# Patient Record
Sex: Female | Born: 1970 | Race: White | Hispanic: No | Marital: Married | Smoking: Current some day smoker
Health system: Southern US, Community
[De-identification: ages and names within clinical notes are randomized; demographics above are authoritative.]

## PROBLEM LIST (undated history)

## (undated) HISTORY — PX: CHOLECYSTECTOMY: SHX55

---

## 1997-09-26 ENCOUNTER — Other Ambulatory Visit: Admission: RE | Admit: 1997-09-26 | Discharge: 1997-09-26 | Payer: Self-pay | Admitting: Obstetrics & Gynecology

## 1997-12-19 ENCOUNTER — Other Ambulatory Visit: Admission: RE | Admit: 1997-12-19 | Discharge: 1997-12-19 | Payer: Self-pay | Admitting: Obstetrics and Gynecology

## 1998-01-03 ENCOUNTER — Ambulatory Visit (HOSPITAL_COMMUNITY): Admission: RE | Admit: 1998-01-03 | Discharge: 1998-01-03 | Payer: Self-pay | Admitting: Obstetrics & Gynecology

## 1998-01-03 ENCOUNTER — Other Ambulatory Visit: Admission: RE | Admit: 1998-01-03 | Discharge: 1998-01-03 | Payer: Self-pay | Admitting: Obstetrics and Gynecology

## 1998-02-14 ENCOUNTER — Inpatient Hospital Stay (HOSPITAL_COMMUNITY): Admission: RE | Admit: 1998-02-14 | Discharge: 1998-02-17 | Payer: Self-pay | Admitting: General Surgery

## 1998-02-27 ENCOUNTER — Inpatient Hospital Stay (HOSPITAL_COMMUNITY): Admission: AD | Admit: 1998-02-27 | Discharge: 1998-02-27 | Payer: Self-pay | Admitting: Obstetrics & Gynecology

## 1998-03-21 ENCOUNTER — Inpatient Hospital Stay (HOSPITAL_COMMUNITY): Admission: AD | Admit: 1998-03-21 | Discharge: 1998-03-22 | Payer: Self-pay | Admitting: Obstetrics and Gynecology

## 1999-05-16 ENCOUNTER — Other Ambulatory Visit: Admission: RE | Admit: 1999-05-16 | Discharge: 1999-05-16 | Payer: Self-pay | Admitting: Obstetrics and Gynecology

## 2001-04-21 ENCOUNTER — Emergency Department (HOSPITAL_COMMUNITY): Admission: EM | Admit: 2001-04-21 | Discharge: 2001-04-21 | Payer: Self-pay | Admitting: Emergency Medicine

## 2001-04-21 ENCOUNTER — Encounter: Payer: Self-pay | Admitting: Emergency Medicine

## 2002-08-12 ENCOUNTER — Emergency Department (HOSPITAL_COMMUNITY): Admission: EM | Admit: 2002-08-12 | Discharge: 2002-08-12 | Payer: Self-pay | Admitting: Emergency Medicine

## 2003-03-30 ENCOUNTER — Other Ambulatory Visit: Admission: RE | Admit: 2003-03-30 | Discharge: 2003-03-30 | Payer: Self-pay | Admitting: Obstetrics and Gynecology

## 2003-09-14 ENCOUNTER — Inpatient Hospital Stay (HOSPITAL_COMMUNITY): Admission: AD | Admit: 2003-09-14 | Discharge: 2003-09-14 | Payer: Self-pay | Admitting: Obstetrics and Gynecology

## 2003-09-17 ENCOUNTER — Inpatient Hospital Stay (HOSPITAL_COMMUNITY): Admission: AD | Admit: 2003-09-17 | Discharge: 2003-09-18 | Payer: Self-pay | Admitting: Obstetrics and Gynecology

## 2003-10-28 ENCOUNTER — Other Ambulatory Visit: Admission: RE | Admit: 2003-10-28 | Discharge: 2003-10-28 | Payer: Self-pay | Admitting: Obstetrics and Gynecology

## 2005-03-04 ENCOUNTER — Inpatient Hospital Stay (HOSPITAL_COMMUNITY): Admission: AD | Admit: 2005-03-04 | Discharge: 2005-03-04 | Payer: Self-pay | Admitting: Obstetrics and Gynecology

## 2006-07-16 ENCOUNTER — Inpatient Hospital Stay (HOSPITAL_COMMUNITY): Admission: AD | Admit: 2006-07-16 | Discharge: 2006-07-16 | Payer: Self-pay | Admitting: Obstetrics and Gynecology

## 2006-07-21 ENCOUNTER — Observation Stay (HOSPITAL_COMMUNITY): Admission: AD | Admit: 2006-07-21 | Discharge: 2006-07-22 | Payer: Self-pay | Admitting: Gynecology

## 2006-08-25 ENCOUNTER — Inpatient Hospital Stay (HOSPITAL_COMMUNITY): Admission: AD | Admit: 2006-08-25 | Discharge: 2006-08-27 | Payer: Self-pay | Admitting: Obstetrics and Gynecology

## 2006-09-01 ENCOUNTER — Inpatient Hospital Stay (HOSPITAL_COMMUNITY): Admission: AD | Admit: 2006-09-01 | Discharge: 2006-09-01 | Payer: Self-pay | Admitting: Obstetrics and Gynecology

## 2007-01-30 ENCOUNTER — Inpatient Hospital Stay (HOSPITAL_COMMUNITY): Admission: AD | Admit: 2007-01-30 | Discharge: 2007-02-01 | Payer: Self-pay | Admitting: Obstetrics and Gynecology

## 2007-01-30 ENCOUNTER — Encounter (INDEPENDENT_AMBULATORY_CARE_PROVIDER_SITE_OTHER): Payer: Self-pay | Admitting: Obstetrics and Gynecology

## 2007-11-14 ENCOUNTER — Emergency Department (HOSPITAL_COMMUNITY): Admission: EM | Admit: 2007-11-14 | Discharge: 2007-11-14 | Payer: Self-pay | Admitting: Emergency Medicine

## 2007-12-07 ENCOUNTER — Ambulatory Visit: Payer: Self-pay | Admitting: Internal Medicine

## 2007-12-08 ENCOUNTER — Ambulatory Visit: Payer: Self-pay | Admitting: *Deleted

## 2008-09-08 ENCOUNTER — Emergency Department (HOSPITAL_COMMUNITY): Admission: EM | Admit: 2008-09-08 | Discharge: 2008-09-08 | Payer: Self-pay | Admitting: Emergency Medicine

## 2009-04-04 ENCOUNTER — Emergency Department (HOSPITAL_COMMUNITY): Admission: EM | Admit: 2009-04-04 | Discharge: 2009-04-05 | Payer: Self-pay | Admitting: Emergency Medicine

## 2010-07-28 ENCOUNTER — Encounter: Payer: Self-pay | Admitting: *Deleted

## 2010-10-12 LAB — CBC
HCT: 43.8 % (ref 36.0–46.0)
Hemoglobin: 15 g/dL (ref 12.0–15.0)
MCHC: 34.2 g/dL (ref 30.0–36.0)
Platelets: 320 10*3/uL (ref 150–400)
RDW: 12.8 % (ref 11.5–15.5)

## 2010-10-12 LAB — URINALYSIS, ROUTINE W REFLEX MICROSCOPIC
Bilirubin Urine: NEGATIVE
Ketones, ur: NEGATIVE mg/dL
Nitrite: NEGATIVE
Protein, ur: NEGATIVE mg/dL
pH: 6 (ref 5.0–8.0)

## 2010-10-12 LAB — DIFFERENTIAL
Lymphs Abs: 4.8 10*3/uL — ABNORMAL HIGH (ref 0.7–4.0)
Monocytes Absolute: 1.3 10*3/uL — ABNORMAL HIGH (ref 0.1–1.0)
Monocytes Relative: 6 % (ref 3–12)
Neutro Abs: 14.7 10*3/uL — ABNORMAL HIGH (ref 1.7–7.7)
Neutrophils Relative %: 70 % (ref 43–77)

## 2010-10-12 LAB — POCT PREGNANCY, URINE: Preg Test, Ur: NEGATIVE

## 2010-10-12 LAB — COMPREHENSIVE METABOLIC PANEL
Albumin: 4 g/dL (ref 3.5–5.2)
BUN: 5 mg/dL — ABNORMAL LOW (ref 6–23)
Calcium: 9.1 mg/dL (ref 8.4–10.5)
Glucose, Bld: 99 mg/dL (ref 70–99)
Sodium: 138 mEq/L (ref 135–145)
Total Protein: 7 g/dL (ref 6.0–8.3)

## 2010-10-18 LAB — POCT I-STAT, CHEM 8
Calcium, Ion: 1.03 mmol/L — ABNORMAL LOW (ref 1.12–1.32)
Glucose, Bld: 119 mg/dL — ABNORMAL HIGH (ref 70–99)
HCT: 50 % — ABNORMAL HIGH (ref 36.0–46.0)
Hemoglobin: 17 g/dL — ABNORMAL HIGH (ref 12.0–15.0)
TCO2: 26 mmol/L (ref 0–100)

## 2010-10-18 LAB — COMPREHENSIVE METABOLIC PANEL
AST: 32 U/L (ref 0–37)
Albumin: 4.2 g/dL (ref 3.5–5.2)
Alkaline Phosphatase: 71 U/L (ref 39–117)
BUN: 14 mg/dL (ref 6–23)
Chloride: 95 mEq/L — ABNORMAL LOW (ref 96–112)
Potassium: 3.4 mEq/L — ABNORMAL LOW (ref 3.5–5.1)
Total Bilirubin: 1.6 mg/dL — ABNORMAL HIGH (ref 0.3–1.2)

## 2010-10-18 LAB — URINE MICROSCOPIC-ADD ON

## 2010-10-18 LAB — CBC
HCT: 46.7 % — ABNORMAL HIGH (ref 36.0–46.0)
Platelets: 319 10*3/uL (ref 150–400)
RBC: 5.14 MIL/uL — ABNORMAL HIGH (ref 3.87–5.11)
WBC: 13.4 10*3/uL — ABNORMAL HIGH (ref 4.0–10.5)

## 2010-10-18 LAB — DIFFERENTIAL
Basophils Absolute: 0 10*3/uL (ref 0.0–0.1)
Basophils Relative: 0 % (ref 0–1)
Eosinophils Relative: 0 % (ref 0–5)
Monocytes Absolute: 1.1 10*3/uL — ABNORMAL HIGH (ref 0.1–1.0)
Neutro Abs: 9.1 10*3/uL — ABNORMAL HIGH (ref 1.7–7.7)

## 2010-10-18 LAB — POCT PREGNANCY, URINE: Preg Test, Ur: NEGATIVE

## 2010-10-18 LAB — URINALYSIS, ROUTINE W REFLEX MICROSCOPIC
Leukocytes, UA: NEGATIVE
Nitrite: NEGATIVE
Specific Gravity, Urine: 1.023 (ref 1.005–1.030)
pH: 6.5 (ref 5.0–8.0)

## 2010-10-18 LAB — URINE CULTURE: Colony Count: NO GROWTH

## 2010-11-20 NOTE — H&P (Signed)
NAME:  Brenda Rodgers, Brenda Rodgers NO.:  [id number]   MEDICAL RECORD NO.:  [id number]          PATIENT TYPE:  INP   LOCATION:  9171                          FACILITY:  [facility]   PHYSICIAN:  Crist Fat. Rivard, M.D. DATE OF BIRTH:  16-Feb-1971   DATE OF ADMISSION:  01/30/2007  DATE OF DISCHARGE:                              HISTORY & PHYSICAL   This is a 40 year old, gravida 4, para 3, 0, 0, 3, at 39-5/7 weeks who  presents with leaking fluid and strong contractions.  She has positive  fetal movement.  Pregnancy has been followed by the nurse midwife  service and remarkable for:  1. Previous cesarean section with 2 v-backs.  2. History of positive PPD.  3. AMA.  4. Hyperemesis.  5. Group B strep negative.   ALLERGIES:  LATEX.   OB HISTORY:  Remarkable for C-section in 1992 of a female infant at [redacted]  weeks gestation weighing 7 pounds 4 ounces.  Remarkable for failure to  progress.  She had a v-back in 1999 of a female infant at [redacted] weeks  gestation weighing 8 pounds 3 ounces.  She had another v-back in 2005 of  a female infant at [redacted] weeks gestation weighing 7 pounds 8 ounces.  Remarkable for an abnormal quad screen.   MEDICAL HISTORY:  Remarkable for history of hyperemesis in all  pregnancies.  History of preterm labor.  Childhood varicella.  History  of abnormality pap in 2004.  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   SURGICAL HISTORY:  Remarkable for C-section in 1992, cholecystectomy in  1999 and wisdom teeth.   FAMILY HISTORY:  Remarkable for a father with heart disease and an aunt  with varicosities, aunt with emphysema and diabetes and leukemia,  another aunt with lung cancer, grandfather with unknown type of cancer.   GENETIC HISTORY:  Remarkable for the patient's age of 49 and a first  cousin with cleft palate.   SOCIAL HISTORY:  The patient is married to [employer] who is involved  and supportive.  She is of the [place] faith.  She denies  any alcohol,  tobacco or drug use.   HISTORY OF CURRENT PREGNANCY:  Patient under care at [redacted] weeks gestation.  She was treated with Phenergan and Zofran for hyperemesis.  She had a  first trimester screen that was normal.  She had an ultrasound at 18  weeks that was normal.  She declined quad screen and amniocentesis.  She  had a Glucola at 27 weeks that was normal.  She had some contractions at  26 weeks with negative fetal fibronectin.  She did sign tubal ligation  papers and she had a group B strep that was negative at term.   OBJECTIVE DATA:  VITAL SIGNS:  Stable, afebrile.  HEENT:  Within normal limits.  Thyroid normal and not enlarged.  CHEST:  Clear to auscultation.  HEART:  Regular rate and rhythm.  ABDOMEN:  Gravid, 38 cm, vertex, Leopold, EFM shows reactive fetal heart  rate with contractions every 2-3 minutes, cervix is 7-8 cm,  completely  effaced, -1 station with a vertex presentation and bulging membranes.  Small amount of clear fluid is leaking.  EXTREMITIES:  Within normal limits.   ASSESSMENT:  1. Intrauterine pregnancy at 39-5/7 weeks.  2. Active labor, transition.   PLAN:  1. Admit to birthing suites, Dr. Estanislado Pandy notified.  2. Routine CNM orders.  3. The patient requests epidural.      Marie L. Williams, C.N.M.      Crist Fat Rivard, M.D.  Electronically Signed    MLW/MEDQ  D:  01/30/2007  T:  01/30/2007  Job:  [id number]

## 2010-11-20 NOTE — Discharge Summary (Signed)
NAME:  Brenda Rodgers, Brenda Rodgers NO.:  [id number]   MEDICAL RECORD NO.:  [id number]          PATIENT TYPE:  INP   LOCATION:  9119                          [facility]   PHYSICIAN:  Crist Fat. Rivard, M.D. DATE OF BIRTH:  [birth year]   DATE OF ADMISSION:  01/30/2007  DATE OF DISCHARGE:  02/01/2007                               DISCHARGE SUMMARY   ADMISSION DIAGNOSIS:  1. Intrauterine pregnancy at 39-5/7 weeks.  2. Active labor, transition.   DISCHARGE DIAGNOSES:  1. Intrauterine pregnancy at 39-5/7 weeks.  2. Active labor, transition.  3. Status post spontaneous vaginal delivery of a female infant named      [place], weighing 7 pounds 4 ounces Apgars of 8 and 9.  4. Desires elective sterilization and status post bilateral tubal      ligation.   HOSPITAL PROCEDURES:  1. Epidural anesthesia.  2. Spontaneous vaginal delivery.  3. Bilateral tubal ligation.   HOSPITAL COURSE:  The patient entered care in active labor with cervix  of 7-8 cm.  She requested an epidural and that was placed and she  delivered soon thereafter with a spontaneous vaginal delivery of a  female named Julian Reil, Apgars 08/09 weighing 7 pounds 4 ounces with tight  shoulders.  She had no complications. Otherwise placenta delivered  without problems and she was found to have no laceration.  She had  requested a tubal ligation.  This was performed a short time after birth  with Dr. Estanislado Pandy under epidural anesthesia with no complications.  On  postpartum day #1 she was doing well, breast-feeding, hemoglobin was  10.4.  Her exam was normal and she was given routine care.  On  postpartum day #2 she was ready to go home.  She was breast-feeding  well, up and about without problems, eating and tolerating food, chest  was clear.  Heart rate regular rate and rhythm.  Abdomen: Soft and  appropriately tender.  Incision was clean, dry and intact over  umbilicus.  Lochia was within normal limits.  Extremities within  normal  limits.  She was deemed to received full benefit of hospital stay and  discharged home.   DISCHARGE MEDICATIONS:  1. Motrin 600 mg p.o. q.6 hours p.r.n.  2. Tylox one to two p.o. q.4 hours p.r.n.   DISCHARGE LABS:  White blood cell count 12.5, hemoglobin 10.4, platelets  281.  RPR nonreactive.   DISCHARGE INSTRUCTIONS:  Per [facility] handout.  Discharge follow up in 6  weeks or p.r.n.   CONDITION ON DISCHARGE:  Good.      Marie L. Williams, C.N.M.      Crist Fat Rivard, M.D.  Electronically Signed    MLW/MEDQ  D:  02/01/2007  T:  02/01/2007  Job:  [id number]

## 2010-11-20 NOTE — Op Note (Signed)
NAME:  Brenda Rodgers, Brenda Rodgers NO.:  [id number]   MEDICAL RECORD NO.:  [id number]          PATIENT TYPE:  INP   LOCATION:  9119                          [facility]   PHYSICIAN:  Crist Fat. Rivard, M.D. DATE OF BIRTH:  1970/12/22   DATE OF PROCEDURE:  01/30/2007  DATE OF DISCHARGE:                               OPERATIVE REPORT   PREOPERATIVE DIAGNOSIS:  Desire for sterilization.   POSTOPERATIVE DIAGNOSIS:  Desire for sterilization.   ANESTHESIA:  Epidural, Dr. Jean Rosenthal.   PROCEDURE:  Postpartum bilateral tubal ligation.   SURGEON:  Crist Fat. Rivard, M.D.   ESTIMATED BLOOD LOSS:  Minimal.   PROCEDURE IN DETAIL:  After being informed of the planned procedure with  possible complications including bleeding, infection, injury to other  organs, irreversibility and failure rate of 1 in 1000, informed consent  is obtained.  The patient is taken to OR #3 and previously placed  epidural anesthesia is reinforced.  The patient is then prepped and  draped in a sterile fashion and her bladder is emptied with an in-and-  out Foley catheter.  After assessing an adequate level of anesthesia, we  infiltrate the umbilical area with 10 mL of Marcaine 0.25% and we  perform a semielliptical incision which is brought down bluntly to the  fascia.  The fascia is identified, grasped with Kocher forceps and  incised, and the peritoneum is entered bluntly.   We first identified the right tube which is grasped with Babcock forceps  until the fimbriae is exteriorized then a section of the isthmic  ampullary area is chosen.  The mesosalpinx is entered with  cauterization.  We doubly ligate each stump with 0 chromic, remove a  section of 1.5 cm of isthmic ampullary area and cauterize each stump.  Hemostasis is adequate and we proceed in the same exact fashion on the  left side, again after identifying the fimbriae.   The fascia is then closed with a running suture of 0 Vicryl.  Hemostasis  was completed with cautery and the skin is closed with a subcuticular  suture of 3-0 Monocryl and Steri-Strips.  Instrument and sponge count is  complete x2.  Estimated blood loss is minimal.  The procedure is very  well tolerated by the patient who is taken to the recovery room in a  well and stable condition.   SPECIMEN:  Portion of right tube and left tube sent to pathology.     Crist Fat Rivard, M.D.  Electronically Signed    SAR/MEDQ  D:  01/30/2007  T:  01/31/2007  Job:  [id number]

## 2010-11-23 NOTE — H&P (Signed)
NAME:  Brenda Rodgers, Brenda Rodgers                           ACCOUNT NO.:  [id number]   MEDICAL RECORD NO.:  [id number]                   PATIENT TYPE:  INP   LOCATION:  9163                                 [facility]   PHYSICIAN:  Naima A. Dillard, M.D.              DATE OF BIRTH:  [birth year]   DATE OF ADMISSION:  09/17/2003  DATE OF DISCHARGE:                                HISTORY & PHYSICAL   HISTORY OF PRESENT ILLNESS:  Ms. Brenda Rodgers is a 40 year old white female  gravida 3 para 2-0-0-2 at [redacted] weeks gestation by early ultrasound who  presents complaining of uterine contractions every 3-4 minutes since about  3:45 a.m. this morning.  She reports increased discharge but no gushes of  fluid.  She denies any vaginal bleeding, nausea, vomiting, headaches or  visual disturbances.  Her pregnancy has been followed at [facility] by the certified nurse midwife service and has been essentially  uncomplicated though at risk for:  1. A previous low transverse cesarean section with a subsequent vaginal     birth and desires a vaginal birth again this time.  2. Increased risk of Downs syndrome on her quad screen and declined     amniocentesis.  Targeted ultrasound decreased her risk from 1:100 to     1:200.  3. 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   Her group B strep is negative and she is currently requesting an epidural  for labor.   OBSTETRICAL AND GYNECOLOGICAL HISTORY:  She is a gravida 3 para 2-0-0-2.  Delivered a viable female in August 1992 who weighed 7 pounds 14 ounces at  [redacted] weeks gestation.  She had that C-section secondary to failure to progress  and prolonged rupture of the membranes.  In September 1999 she delivered a  viable female infant who weighed 8 pounds 3 ounces at [redacted] weeks gestation  vaginally without complications and did have an epidural with that labor.   GENERAL MEDICAL HISTORY:  She  has no known drug allergies.  She reports that  sometimes latex gloves irritate her hands but she has never had any other  reaction.  She reports having had the usual childhood diseases.  She reports  a positive TB skin test and a chest x-ray that was negative in 1992 and in  2002.  She has smoked early in this pregnancy but discontinued somewhere in  the middle of the pregnancy.   FAMILY HISTORY:  Significant for father with angina and has had two stents  and balloon therapy.  Maternal aunt with varicosities.  Maternal aunt with  emphysema and adult-onset diabetes, and maternal aunt with leukemia, now  deceased.  A paternal uncle with questionable lung cancer.  Maternal  grandmother with unknown kind of cancer.   SURGICAL HISTORY:  Includes a cesarean section in  1992, cholecystectomy  during her pregnancy in 1999, and wisdom teeth removed.   GENETIC HISTORY:  Significant for a first cousin with a cleft palate.   SOCIAL HISTORY:  She is not currently married.  The father of the baby is  involved and supportive and his name is Merlyn Albert.  They are of the [place]  faith.  They deny any drug use or alcohol during the pregnancy and smoking  discontinued at approximately 30 weeks.   PRENATAL LABORATORY DATA:  Her blood type is O positive, antibody screen is  negative.  Toxo titers are negative.  Syphilis is nonreactive.  Rubella is  immune.  Hepatitis B surface antigen is negative.  HIV is nonreactive.  GC  and chlamydia are both negative.  Pap was ASCUS and a follow-up showed  positive high-risk HPV.  Cystic fibrosis is negative and parvo titers were  immune.  Her 36-week beta strep was negative.   PHYSICAL EXAMINATION:  VITAL SIGNS:  Stable, she is afebrile.  HEENT:  Grossly within normal limits.  HEART:  Regular rhythm and rate.  CHEST:  Clear.  BREASTS:  Soft and nontender.  ABDOMEN:  Gravid with uterine contractions every 2-3 minutes.  Her fetal  heart rate is reactive and reassuring  with a baseline in the 120s.  PELVIC:  Her cervix is 6 cm, 95%, vertex, -1, with bulging membranes.  EXTREMITIES:  Within normal limits.   ASSESSMENT:  1. Intrauterine pregnancy at term.  2. Active labor.  3. Negative group B Streptococcus.  4. Previous cesarean section, desiring vaginal birth attempt and history of     previous vaginal birth successfully.  5. Desires epidural for labor.   PLAN:  Admit to labor and delivery.  VBAC consent was reviewed.  The patient  expresses desire to proceed with a vaginal birth.  The risks of uterine  rupture and possible damage to self or fetus were reviewed once again with  the patient.     Concha Pyo. Duplantis, C.N.M.              Naima A. Normand Sloop, M.D.    SJD/MEDQ  D:  09/17/2003  T:  09/17/2003  Job:  [id number]

## 2010-11-23 NOTE — H&P (Signed)
NAME:  Brenda Rodgers, Brenda Rodgers NO.:  [id number]   MEDICAL RECORD NO.:  [id number]          PATIENT TYPE:  INP   LOCATION:  9317                          [facility]   PHYSICIAN:  Hal Morales, M.D.DATE OF BIRTH:  05/18/1971   DATE OF ADMISSION:  08/25/2006  DATE OF DISCHARGE:                              HISTORY & PHYSICAL   Brenda Rodgers is a 40 year old gravida 4, para 3-0-0-3 at 16-5/7th weeks  by last menstrual period who presents with nausea and vomiting today.  She has been unable to keep anything down.  Most recently, she has been  using Zofran and Phenergan with good relief until today.  She was  hospitalized overnight, July 21, 2006, for similar complaints.  However, at that time, she was unable to take her Zofran and Phenergan  at home prior to coming to be evaluated.  Her pregnancy has been  followed by the [facility] service.  It has been remarkable  for 1) previous low transverse cesarean section with 2 subsequent  vaginal deliveries; 2) history of high risk HPV.  Her prenatal labs were  collected on July 30, 2006.  Hemoglobin 13.5, hematocrit 39.0,  platelets 369,000.  Blood type O positive.  Antibody negative.  RPR  nonreactive.  Rubella immuned.  Hepatitis B surface antigen negative.  HIV negative.  Pap smear within normal limits.  Gonorrhea negative.  Chlamydia negative.  Cystic fibrosis negative.  Her weight at her first  prenatal visit at 12 weeks was 172 pounds and currently is 171 pounds.   HISTORY OF PRESENT PREGNANCY:  She presented for care at [facility] on July 30, 2006 at 12-1/7th weeks gestation.  At  that point, she was doing well post hospitalization and was taking her  Phenergan and Zofran with good results.   OB HISTORY:  She is a gravida 4, para 3-0-0-3.  In 1992, she had a C-  section for a female infant weighing 7 pounds and 4 ounces at [redacted] weeks  gestation after 30 hours of labor.  She had a  spinal for anesthesia.  Failure to progress was the reason for the C-section.  She had dilated  to 3 cm and infant's name was [facility].  In September 1999, she had a  vaginal delivery of a female infant weighing 8 pounds and 3 ounces at [redacted]  weeks gestation with an epidural for anesthesia.  This was a VBAC and  infants name was [place].  In March 2005, she had another vaginal  delivery of a female infant weighing 7 pounds and 8 ounces at [redacted] weeks  gestation after 7 hours in labor.  She had an epidural for anesthesia.  This was also a VBAC and infants name was [place].  This fourth  pregnancy is the present pregnancy.   PAST MEDICAL HISTORY:  She has no medication allergies.  She has  irritation to LATEX gloves.  She experienced menarche at the age of 17  with 30-day cycles lasting 4 days.  She has had hyperemesis with all her  pregnancies.  With her second pregnancy,  she required terbutaline.  She  has used Depo-Provera in the past for contraception.  She had a Pap  smear in 2004 with ascus result.  She reports having had the usual  childhood illnesses.  She had a positive TB test in 1992 with negative  chest x-ray in 2006.  She is a previous smoker.  Surgical history is  remarkable for C-section in 1992, cholecystectomy in 1999 and wisdom  teeth extraction.   FAMILY MEDICAL HISTORY:  Remarkable for father with heart disease.  Maternal aunt with varicosities.  Maternal aunt with emphysema.  Maternal aunt with diabetes.  Maternal aunt with leukemia.  Paternal  aunt with lung cancer.  Maternal grandfather with unknown type of  cancer.   GENETIC HISTORY:  Remarkable for the patient will be age 2 at the time  of delivery and also first cousin with cleft palate.   SOCIAL HISTORY:  The patient is married to the father of the baby.  He  is involved and supportive.  His name is Merlyn Albert.  They are of the  [place] faith.  The patient has a college education and is employed  full time.  The father of  the baby has 16 years of education and is a  Systems analyst.  They deny any alcohol, tobacco or illicit drug use with  the pregnancy.   OBJECTIVE DATA:  VITAL SIGNS:  Stable.  Temperature is 98.2, pulse 102,  respirations 20, blood pressure 100/61.  Weight is 171 pounds and was  172 pounds at [redacted] weeks gestation.  HEENT:  Grossly within normal limits.  CHEST:  Clear to auscultation.  HEART:  Regular rate and rhythm.  ABDOMEN:  Gravid and contour with fundal height at 16-17 weeks.  Fetal  heart tones are dopplered in the 150s.  PELVIC EXAM:  Deferred.  EXTREMITIES:  Within normal limits.   Urinalysis from today shows 3+ ketones.   ASSESSMENT:  1. Intrauterine pregnancy at 15-5/7th weeks.  2. Hyperemesis.   PLAN:  To keep overnight in observation status while receiving IV fluids  at 125 mL an hour and Phenergan as needed for nausea and vomiting.      Cam Hai, C.N.M.      Hal Morales, M.D.  Electronically Signed    KS/MEDQ  D:  08/25/2006  T:  08/25/2006  Job:  [id number]

## 2010-11-23 NOTE — Discharge Summary (Signed)
NAME:  Brenda Rodgers, Brenda Rodgers                ACCOUNT NO.:  [id number]   MEDICAL RECORD NO.:  [id number]          PATIENT TYPE:  INP   LOCATION:  9317                          [facility]   PHYSICIAN:  Naima A. Dillard, M.D. DATE OF BIRTH:  Mar 16, 1971   DATE OF ADMISSION:  08/25/2006  DATE OF DISCHARGE:  08/27/2006                               DISCHARGE SUMMARY   ADMITTING DIAGNOSES:  1. Intrauterine pregnancy at 16-5/7 weeks.  2. Hyperemesis.   DISCHARGE DIAGNOSES:  1. Intrauterine pregnancy at 17 weeks.  2. Improved nausea and vomiting.   Ms. Lamaster is a 40 year old gravida 3, para 0-0-3 who presented at 40-  5/7 weeks with nausea and vomiting.  This has been constant throughout  her pregnancy and she has been hospitalized previously for the same  complaint.  Zofran and Phenergan have not been effective to relieve this  nausea and vomiting.  She was admitted and begun on IV fluids with  Phenergan and Zofran p.r.n.  These did not facilitate improvement of  situation until yesterday with the addition of a IV Reglan 10 mg p.o.  q.i.d.  Since then, the patient reports significantly improved nausea,  she is tolerating p.o. fluids and crackers.  She is advancing her diet  with no emesis and requests discharge home.  She is therefore discharged  home in satisfactory condition.   She has received a prescription for Reglan 10 mg p.o. q.i.d. to take 1/2  hour prior to meals and at bedtime.  She may continue use of Phenergan  and Zofran p.r.n.   She will follow up as scheduled at the office of [facility] next Friday, September 06, 2006, and in the meantime, call for any increased nausea or vomiting  or any problems or concerns.      Rica Koyanagi, C.N.M.      Naima A. Normand Sloop, M.D.  Electronically Signed    SDM/MEDQ  D:  08/27/2006  T:  08/27/2006  Job:  [id number]

## 2010-11-23 NOTE — Discharge Summary (Signed)
NAME:  Brenda Rodgers, DONATHAN NO.:  [id number]   MEDICAL RECORD NO.:  [id number]          PATIENT TYPE:  OBV   LOCATION:  9302                          [facility]   PHYSICIAN:  Hal Morales, M.D.DATE OF BIRTH:  1971-01-13   DATE OF ADMISSION:  07/21/2006  DATE OF DISCHARGE:                               DISCHARGE SUMMARY   ADMITTING DIAGNOSES:  1. Intrauterine pregnancy at 11 weeks 6 days.  2. Hyperemesis.   DISCHARGE DIAGNOSES:  1. Intrauterine pregnancy at 11 weeks 6 days.  2. Hyperemesis.   Ms. Cislo is a 39 year old gravida 4, para 3-0-0-3 who presents for  hyperemesis with increased nausea and vomiting x24 hours.  She was  unable to keep anything down and was seen on July 16, 2006, in  maternity admissions.  She received IV fluids with Phenergan and was  prescribed Phenergan and Zofran at that time.  However, due to her  insurance issues she was not able to get Zofran filled and has not been  taking her Phenergan consistently.  After receiving two bags of IV  fluids with Zofran and 10 mg of Reglan the patient continued with nausea  and vomiting and was therefore admitted for 23-hour observation.  Her  urine on admission showed a specific gravity of 1.020 with greater than  80 ketones/dL.  On this January 15, she is improving in her hyperemesis.  She has had no vomiting since 7 p.m. last night and is feeling much  better.  She is tolerating clear fluids and is in satisfactory condition  for discharge home.  She is discharged home with prescriptions for  Reglan and Zofran.  She will discontinue the Reglan in 48 hours.  Case  management consult was obtained secondary to social issues and problems  obtaining prescriptions.  This will be provided prior to discharge.  She  will follow up at the office of [facility] on July 30, 2006, as previously  scheduled, and call for any further problems or concerns.      Rica Koyanagi, C.N.M.      Hal Morales, M.D.  Electronically Signed    SDM/MEDQ  D:  07/22/2006  T:  07/22/2006  Job:  [id number]

## 2010-11-23 NOTE — H&P (Signed)
NAME:  Brenda, Rodgers NO.:  [id number]   MEDICAL RECORD NO.:  [id number]          PATIENT TYPE:  MAT   LOCATION:  [facility]                          FACILITY:  [facility]   PHYSICIAN:  Osborn Coho, M.D.   DATE OF BIRTH:  Nov 13, 1970   DATE OF ADMISSION:  07/21/2006  DATE OF DISCHARGE:                              HISTORY & PHYSICAL   Mrs. Tuley is a 40 year old gravida 4, para 3-0-0-3 at 32 and four-  sevenths weeks by LMP who presents with nausea and vomiting the last 24  hours.  She has been unable to keep anything down.  She was seen on  January 9 at maternity admissions for the same complaint.  She received  IV fluids with Phenergan and was prescribed Phenergan and Zofran.  However, due to her insurance issues she has not been able to get the  Zofran filled and only had a few Phenergan.  She has had nausea and  vomiting with each of her pregnancies.  She has had no recent viral or  food exposures.   History is remarkable for:  1. Previous low transverse cesarean section with two subsequent      vaginal deliveries.  2. 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   She denies any nausea.  She does have some abdominal pain with retching.   OBSTETRICAL AND GYNECOLOGICAL HISTORY:  The patient is a gravida 4, para  3-0-0-3.  She had a viable female in 1992 who weighed 7 pounds 14 ounces  at [redacted] weeks gestation.  That was a C-section birth secondary to failure  to progress and prolonged rupture of the membranes.  In 1999 she had a  viable female infant who weighed 8 pounds 3 ounces at [redacted] weeks gestation.  In March 2005 she had a vaginal birth after cesarean as well.  She had  no complications.  She did have nausea and vomiting with each of her  pregnancies.   GENERAL MEDICAL HISTORY:  She had the usual childhood diseases.  She  reports a positive TB skin test and chest x-ray that was negative in  1992 and in 2002.  She is a previous smoker but discontinued with her  last pregnancy.   ALLERGIES:  None.   FAMILY HISTORY:  Father has angina and has had two stents and balloon  therapy.  Paternal __________ had varicosities.  Maternal aunt with  emphysema and adult-onset diabetes.  Maternal aunt with leukemia, now  deceased.  A paternal uncle with questionable lung cancer.  Maternal  grandmother with unknown type of cancer.   SURGICAL HISTORY:  Includes previous cesarean section in 1992.  She had  a cholecystectomy during her pregnancy in 1999.  Wisdom teeth removed in  the past.   GENETIC HISTORY:  Remarkable for a first cousin with a cleft palate.   SOCIAL HISTORY:  The patient is married to the father of the baby.  He  is involved and supportive.  His name is Klare Criss.  The patient is  employed as a Runner, broadcasting/film/video in childcare.  Her partner is also employed.  He  is the  father of her other children.  The patient denies any alcohol,  drug or tobacco use during this pregnancy.  They are of the [place]  faith.   LABORATORY DATA:  Her blood type is O positive.  Cystic fibrosis testing  was negative with her previous pregnancy.  Urine sample today shows  specific gravity of 1.020, small bilirubin, greater than 80 ketones, 100  of protein, negative leukocyte esterase and negative nitrites.   PHYSICAL EXAMINATION:  VITAL SIGNS:  Stable, the patient is afebrile.  HEENT:  Within normal limits.  LUNGS:  Bilateral breath sounds are clear.  HEART:  Regular rate and rhythm without murmur.  BREASTS:  Soft and nontender.  ABDOMEN:  Fundal height is approximately 11-12 weeks.  Fetal heart rate  is 160.  PELVIC:  Deferred.  There is negative CVA tenderness noted.   Ultrasound today shows a single intrauterine pregnancy at 11 weeks 6  days with an [facility] of February 03, 2007.  Ovaries are within normal limits.  There is a posterior placenta, normal fluid.  The patient has received  two bags of IV fluids, 8 mg of Zofran, and 10 mg of Reglan IV.  She  still has retching with  activity.   REVIEW OF SYSTEMS:  The patient reports the previously-noted nausea and  vomiting.   IMPRESSION:  1. Intrauterine pregnancy at 11 weeks 6 days.  2. Hyperemesis.   PLAN:  1. Admit to the Women's Unit at the [facility] [place] for      23-hour observation secondary to hyperemesis.  2. Will continue IV hydration.  3. Zofran 8 mg IV q.8h.  4. Reglan 10 mg q.i.d. IV.  5. Reevaluate in the morning for tolerance of food and fluids.      Renaldo Reel Emilee Hero, C.N.M.      Osborn Coho, M.D.  Electronically Signed    VLL/MEDQ  D:  07/21/2006  T:  07/21/2006  Job:  [id number]

## 2011-04-22 LAB — CBC
Platelets: 338
RBC: 3.05 — ABNORMAL LOW
RDW: 12.9
WBC: 12.5 — ABNORMAL HIGH

## 2012-07-11 ENCOUNTER — Emergency Department (HOSPITAL_COMMUNITY): Payer: Self-pay

## 2012-07-11 ENCOUNTER — Emergency Department (HOSPITAL_COMMUNITY)
Admission: EM | Admit: 2012-07-11 | Discharge: 2012-07-11 | Disposition: A | Discharge status: Home / Self Care | Payer: Self-pay | Attending: Emergency Medicine | Admitting: Emergency Medicine

## 2012-07-11 DIAGNOSIS — S99919A Unspecified injury of unspecified ankle, initial encounter: Secondary | ICD-10-CM | POA: Insufficient documentation

## 2012-07-11 DIAGNOSIS — M25569 Pain in unspecified knee: Secondary | ICD-10-CM

## 2012-07-11 DIAGNOSIS — S8990XA Unspecified injury of unspecified lower leg, initial encounter: Secondary | ICD-10-CM | POA: Insufficient documentation

## 2012-07-11 DIAGNOSIS — R296 Repeated falls: Secondary | ICD-10-CM | POA: Insufficient documentation

## 2012-07-11 DIAGNOSIS — Y929 Unspecified place or not applicable: Secondary | ICD-10-CM | POA: Insufficient documentation

## 2012-07-11 DIAGNOSIS — Y939 Activity, unspecified: Secondary | ICD-10-CM | POA: Insufficient documentation

## 2012-07-11 MED ORDER — OXYCODONE-ACETAMINOPHEN 5-325 MG PO TABS: 1.0000 | ORAL_TABLET | ORAL | Status: DC | PRN

## 2012-07-11 MED ORDER — IBUPROFEN 400 MG PO TABS
600.0000 mg | ORAL_TABLET | Freq: Once | ORAL | Status: AC
  Administered 2012-07-11: 600 mg via ORAL
  Filled 2012-07-11: qty 1

## 2012-07-11 NOTE — Progress Notes (Signed)
Orthopedic Tech Progress Note Patient Details:  Brenda Rodgers 1970-09-27 [id number]  Ortho Devices Type of Ortho Device: Knee Immobilizer;Crutches Ortho Device/Splint Location: left knee Ortho Device/Splint Interventions: Application   Brenda Rodgers 07/11/2012, 1:40 PM

## 2012-07-11 NOTE — ED Notes (Signed)
Patient transported to X-ray 

## 2012-07-11 NOTE — ED Notes (Signed)
Pt "stepped down hard" on left knee. Now has pain and limited mobility.

## 2012-07-15 NOTE — ED Provider Notes (Signed)
History    42 year old female with left knee pain. Onset shortly before arrival. Patient fell onto her knees. Persistent pain since then. She feels like her knee sometimes is catching when she moves it. Worse with range of motion. Cannulate although with increased pain. No numbness or tingling. Patient with no other acute complaints.  CSN: [id number]  Arrival date & time 07/11/12  1232   First MD Initiated Contact with Patient 07/11/12 1247      Chief Complaint  Patient presents with  . Knee Pain    (Consider location/radiation/quality/duration/timing/severity/associated sxs/prior treatment) HPI  No past medical history on file.  No past surgical history on file.  No family history on file.  History  Substance Use Topics  . Smoking status: Not on file  . Smokeless tobacco: Not on file  . Alcohol Use: Not on file    OB History    No data available      Review of Systems  All systems reviewed and negative, other than as noted in HPI.   Allergies  Review of patient's allergies indicates no known allergies.  Home Medications   Current Outpatient Rx  Name  Route  Sig  Dispense  Refill  . OXYCODONE-ACETAMINOPHEN 5-325 MG PO TABS   Oral   Take 1-2 tablets by mouth every 4 (four) hours as needed for pain.   15 tablet   0     BP 118/83  Pulse 100  Temp 97.7 F (36.5 C) (Oral)  Resp 18  SpO2 98%  LMP 07/01/2012  Physical Exam  Nursing note and vitals reviewed. Constitutional: She appears well-developed and well-nourished. No distress.  HENT:  Head: Normocephalic and atraumatic.  Eyes: Conjunctivae normal are normal. Right eye exhibits no discharge. Left eye exhibits no discharge.  Neck: Neck supple.  Cardiovascular: Normal rate, regular rhythm and normal heart sounds.  Exam reveals no gallop and no friction rub.   No murmur heard. Pulmonary/Chest: Effort normal and breath sounds normal. No respiratory distress.  Abdominal: Soft. She exhibits no  distension. There is no tenderness.  Musculoskeletal: She exhibits edema and tenderness.       Diffuse left knee tenderness. Small effusion. No concerning skin changes. No ligamentous laxity appreciated. Increased pain with range of motion. Neurovascular intact distally.  Neurological: She is alert.  Skin: Skin is warm and dry.  Psychiatric: She has a normal mood and affect. Her behavior is normal. Thought content normal.    ED Course  Procedures (including critical care time)  Labs Reviewed - No data to display No results found.  Dg Knee Complete 4 Views Left  07/11/2012  *RADIOLOGY REPORT*  Clinical Data: Injured left knee 4 days ago, persistent pain.  LEFT KNEE - COMPLETE 4+ VIEW  Comparison: None.  Findings: No evidence of acute fracture or dislocation.  Well- preserved joint spaces.  Well-preserved bone mineral density.  No intrinsic osseous abnormalities.  Small joint effusion suspected.  IMPRESSION: No osseous abnormality.  Small joint effusion.   Original Report Authenticated By: Hulan Saas, M.D.     1. Knee pain       MDM  42 year old female with left knee pain. Imaging negative for fracture. Plan symptomatic treatment. No ligamentous laxity appreciated. Patient may  have a meniscal injury with the "catching" that she is experiencing. Crutches. Knee immobilizer. Emergent return precautions discussed. Orthopedic followup        Raeford Razor, MD 07/15/12 [phone]

## 2014-01-26 ENCOUNTER — Emergency Department (HOSPITAL_COMMUNITY): Payer: Self-pay

## 2014-01-26 ENCOUNTER — Encounter (HOSPITAL_COMMUNITY): Payer: Self-pay | Admitting: Emergency Medicine

## 2014-01-26 ENCOUNTER — Emergency Department (HOSPITAL_COMMUNITY)
Admission: EM | Admit: 2014-01-26 | Discharge: 2014-01-26 | Disposition: A | Discharge status: Home / Self Care | Payer: Self-pay | Attending: Emergency Medicine | Admitting: Emergency Medicine

## 2014-01-26 DIAGNOSIS — W108XXA Fall (on) (from) other stairs and steps, initial encounter: Secondary | ICD-10-CM | POA: Insufficient documentation

## 2014-01-26 DIAGNOSIS — S83005A Unspecified dislocation of left patella, initial encounter: Secondary | ICD-10-CM

## 2014-01-26 DIAGNOSIS — Y9389 Activity, other specified: Secondary | ICD-10-CM | POA: Insufficient documentation

## 2014-01-26 DIAGNOSIS — Z87891 Personal history of nicotine dependence: Secondary | ICD-10-CM | POA: Insufficient documentation

## 2014-01-26 DIAGNOSIS — Y92009 Unspecified place in unspecified non-institutional (private) residence as the place of occurrence of the external cause: Secondary | ICD-10-CM | POA: Insufficient documentation

## 2014-01-26 DIAGNOSIS — S83006A Unspecified dislocation of unspecified patella, initial encounter: Secondary | ICD-10-CM | POA: Insufficient documentation

## 2014-01-26 DIAGNOSIS — Z791 Long term (current) use of non-steroidal anti-inflammatories (NSAID): Secondary | ICD-10-CM | POA: Insufficient documentation

## 2014-01-26 MED ORDER — OXYCODONE-ACETAMINOPHEN 5-325 MG PO TABS
1.0000 | ORAL_TABLET | Freq: Once | ORAL | Status: AC
  Administered 2014-01-26: 1 via ORAL
  Filled 2014-01-26: qty 1

## 2014-01-26 MED ORDER — OXYCODONE-ACETAMINOPHEN 5-325 MG PO TABS: 1.0000 | ORAL_TABLET | Freq: Four times a day (QID) | ORAL | Status: AC | PRN

## 2014-01-26 NOTE — Discharge Instructions (Signed)
Patellar Dislocation  A patellar dislocation occurs when your kneecap (patella) slips out of its normal position in a groove in front of the lower end of your thighbone (femur). This groove is called the patellofemoral groove.   CAUSES  The kneecap is normally positioned over the front of the knee joint at the base of the thighbone. A kneecap can be dislocated when:  · The kneecap is out of place (patellar tracking disorder), and force is applied.  · The foot is firmly planted pointing outward, and the knee bends with the thigh turned inward. This kind of injury is common during many sports activities.  · The inner edge of the kneecap is hit, pushing it toward the outer side of the leg.  SIGNS AND SYMPTOMS  · Severe pain.  · A misshapen knee that looks like a bone is out of position.  · A popping sensation, followed by a feeling that something is out of place.  · Inability to bend or straighten the knee.  · Knee swelling.  · Cool, pale skin or numbness and tingling in or below the affected knee.  DIAGNOSIS   Your health care provider will physically examine the injured area. An X-ray exam may be done to make sure a bone fracture has not occurred. In some cases, your health care provider may look inside your knee joint with an instrument much like a pencil-sized telescope (arthroscope). This may be done to make sure you have no loose cartilage in your joint. Loose cartilage is not visible on an X-ray image.  TREATMENT   In many instances, the patella can be guided back into position without much difficulty. It often goes back into position by straightening the leg. Often, nothing more may be needed other than a brief period of immobilization followed by the exercises your health care provider recommends. If patellar dislocation starts to become frequent after the first incident, surgery may be needed to prevent your patella from slipping out of place.  HOME CARE INSTRUCTIONS   · Only take over-the-counter or  prescription medicines for pain, discomfort, or fever as directed by your health care provider.  · Use a knee brace if directed to do so by your health care provider.  · Use crutches as instructed.  · Apply ice to the injured knee:  ¨ Put ice in a plastic bag.  ¨ Place a towel between your skin and the bag.  ¨ Leave the ice on for 20 minutes, 2-3 times a day.  · Follow your health care provider's instructions for doing any recommended range-of-motion exercises or other exercises.  SEEK IMMEDIATE MEDICAL CARE IF:  · You have increased pain or swelling in the knee that is not relieved with medicine.  · You have increasing inflammation in the knee.  · You have locking or catching of your knee.  MAKE SURE YOU:  · Understand these instructions.  · Will watch your condition.  · Will get help right away if you are not doing well or get worse.  Document Released: 03/19/2001 Document Revised: 04/14/2013 Document Reviewed: 02/03/2013  ExitCare® Patient Information ©2015 [employer]. This information is not intended to replace advice given to you by your health care provider. Make sure you discuss any questions you have with your health care provider.

## 2014-01-26 NOTE — ED Notes (Signed)
Fell through wooden step on deck at her home this morning.  Right LE has long abrasion/contusion down shin.  Left knee pain, unable to fully extend left leg.  "it's almost like it's stuck"

## 2014-01-26 NOTE — ED Notes (Signed)
Attempting to find a way to assist pt in getting home. Spoken with case management at this time.

## 2014-01-26 NOTE — ED Provider Notes (Signed)
CSN: [id number]     Arrival date & time 01/26/14  7829 History   First MD Initiated Contact with Patient 01/26/14 [phone]     Chief Complaint  Patient presents with  . Fall     (Consider location/radiation/quality/duration/timing/severity/associated sxs/prior Treatment) Patient is a 43 y.o. female presenting with fall. The history is provided by the patient.  Fall This is a new problem. Pertinent negatives include no chest pain, no abdominal pain and no shortness of breath.   patient presents after a fall. He states that her right leg went through a stair and she fell down. The pain in her left knee. She states that her left kneecap dislocated. She states this happened numerous times before. She states she does not have insurance now but when she gets that she will see an orthopedic surgeon for surgery. She states she is pain in the knee and cannot straight down to light. She states she did not hurt anything else was a fall. Complaining of dull pain in the right lower leg.   History reviewed. No pertinent past medical history. Past Surgical History  Procedure Laterality Date  . Cholecystectomy     History reviewed. No pertinent family history. History  Substance Use Topics  . Smoking status: Former Smoker -- 1.00 packs/day for 25 years    Types: Cigarettes    Quit date: 01/16/2014  . Smokeless tobacco: Not on file  . Alcohol Use: Yes     Comment: socially    OB History   Grav Para Term Preterm Abortions TAB SAB Ect Mult Living                 Review of Systems  Constitutional: Negative for fever.  Respiratory: Negative for shortness of breath.   Cardiovascular: Negative for chest pain.  Gastrointestinal: Negative for abdominal pain.  Genitourinary: Negative for dysuria.  Musculoskeletal:       Left knee pain  Skin: Positive for wound.  Neurological: Negative for weakness and numbness.      Allergies  Review of patient's allergies indicates no known allergies.  Home  Medications   Prior to Admission medications   Medication Sig Start Date End Date Taking? Authorizing Provider  ibuprofen (ADVIL,MOTRIN) 200 MG tablet Take 200 mg by mouth 2 (two) times daily.   Yes Historical Provider, MD  oxyCODONE-acetaminophen (PERCOCET/ROXICET) 5-325 MG per tablet Take 1-2 tablets by mouth every 6 (six) hours as needed for severe pain. 01/26/14   Juliet Rude. Read Bonelli, MD   BP 127/72  Pulse 83  Temp(Src) 98.7 F (37.1 C) (Oral)  Resp 20  Ht 5\' 2"  (1.575 m)  Wt 140 lb (63.504 kg)  BMI 25.60 kg/m2  SpO2 97%  LMP 01/08/2014 Physical Exam  Constitutional: She is oriented to person, place, and time. She appears well-developed and well-nourished.  Cardiovascular: Normal rate and regular rhythm.   Pulmonary/Chest: Effort normal and breath sounds normal.  Abdominal: There is no tenderness.  Musculoskeletal: She exhibits tenderness.  Abrasion down right shin anteriorly. Neurovascular intact over her foot. No tenderness of her ankle and knee.  Tenderness to left knee medially. Will not flex or extend knee. Patella in place. NV intact over foot.   Neurological: She is alert and oriented to person, place, and time.    ED Course  Procedures (including critical care time) Labs Review Labs Reviewed - No data to display  Imaging Review Dg Knee Complete 4 Views Left  01/26/2014   CLINICAL DATA:  Knee pain and swelling  status post fall  EXAM: LEFT KNEE - COMPLETE 4+ VIEW  COMPARISON:  Left knee series of July 11, 2012  FINDINGS: The bones are adequately mineralized. There is no acute fracture nor dislocation. There is beaking of the medial tibial spine. The joint spaces are reasonably well-maintained. There is a small suprapatellar joint effusion.  IMPRESSION: Mild degenerative change but no acute bony abnormality.   Electronically Signed   By: David  SwazilandJordan   On: 01/26/2014 07:45     EKG Interpretation None      MDM   Final diagnoses:  Patellar dislocation, left,  initial encounter    Patient with fall. Likely repeat patellar dislocation. May be some internal drainage her knee, but patient has pain and is a difficult examination. Knee appears stable. Knee immobilizer given, pain meds given. Will followup with orthopedic surgery    Juliet Rudeathan R. Rubin PayorPickering, MD 01/26/14 [phone]

## 2014-01-26 NOTE — ED Notes (Signed)
Pt wants to wait on the knee immobilizer at this time.

## 2014-01-26 NOTE — ED Notes (Signed)
Dr. Pickering back at the bedside.  

## 2014-01-26 NOTE — Progress Notes (Signed)
CSW called to assist pt with transportation. Pt reported that she drove to ED after falling through her proch. Pt feels she is capable of driving self home. Pt reported that she does not feel "woozy",she was given narcotics several hours prior. CSW asked ED RN to speak with pt regarding driving. ED RN reported that pt is ok to drive. No further CSW intervention needed.   [address] East Second StreetDoris Michal Rodgers, ConnecticutLCSWA [phone]

## 2014-04-21 ENCOUNTER — Encounter (HOSPITAL_COMMUNITY): Payer: Self-pay | Admitting: Emergency Medicine

## 2014-04-21 ENCOUNTER — Emergency Department (HOSPITAL_COMMUNITY): Payer: Self-pay

## 2014-04-21 ENCOUNTER — Emergency Department (HOSPITAL_COMMUNITY)
Admission: EM | Admit: 2014-04-21 | Discharge: 2014-04-21 | Disposition: A | Discharge status: Home / Self Care | Payer: Self-pay | Attending: Emergency Medicine | Admitting: Emergency Medicine

## 2014-04-21 DIAGNOSIS — S63619A Unspecified sprain of unspecified finger, initial encounter: Secondary | ICD-10-CM

## 2014-04-21 DIAGNOSIS — Y9289 Other specified places as the place of occurrence of the external cause: Secondary | ICD-10-CM | POA: Insufficient documentation

## 2014-04-21 DIAGNOSIS — H9390 Unspecified disorder of ear, unspecified ear: Secondary | ICD-10-CM

## 2014-04-21 DIAGNOSIS — Z87891 Personal history of nicotine dependence: Secondary | ICD-10-CM | POA: Insufficient documentation

## 2014-04-21 DIAGNOSIS — H61892 Other specified disorders of left external ear: Secondary | ICD-10-CM | POA: Insufficient documentation

## 2014-04-21 DIAGNOSIS — H9202 Otalgia, left ear: Secondary | ICD-10-CM | POA: Insufficient documentation

## 2014-04-21 DIAGNOSIS — W231XXA Caught, crushed, jammed, or pinched between stationary objects, initial encounter: Secondary | ICD-10-CM | POA: Insufficient documentation

## 2014-04-21 DIAGNOSIS — Y9389 Activity, other specified: Secondary | ICD-10-CM | POA: Insufficient documentation

## 2014-04-21 DIAGNOSIS — Z79899 Other long term (current) drug therapy: Secondary | ICD-10-CM | POA: Insufficient documentation

## 2014-04-21 DIAGNOSIS — S63612A Unspecified sprain of right middle finger, initial encounter: Secondary | ICD-10-CM | POA: Insufficient documentation

## 2014-04-21 MED ORDER — HYDROCODONE-ACETAMINOPHEN 5-325 MG PO TABS: 2.0000 | ORAL_TABLET | ORAL | Status: DC | PRN

## 2014-04-21 MED ORDER — MUPIROCIN CALCIUM 2 % EX CREA
TOPICAL_CREAM | Freq: Once | CUTANEOUS | Status: DC
  Administered 2014-04-21: 13:00:00 via TOPICAL
  Filled 2014-04-21: qty 15

## 2014-04-21 MED ORDER — MUPIROCIN CALCIUM 2 % EX CREA: 1.0000 "application " | TOPICAL_CREAM | Freq: Two times a day (BID) | CUTANEOUS | Status: AC

## 2014-04-21 MED ORDER — HYDROCODONE-ACETAMINOPHEN 5-325 MG PO TABS
2.0000 | ORAL_TABLET | Freq: Once | ORAL | Status: AC
  Administered 2014-04-21: 2 via ORAL
  Filled 2014-04-21: qty 2

## 2014-04-21 NOTE — ED Provider Notes (Signed)
CSN: [id number]     Arrival date & time 04/21/14  [id number] History  This chart was scribed for non-physician practitioner Junious SilkHannah Katryn Plummer, working with Richardean Canalavid H Yao, MD by Carl Bestelina Holson, ED Scribe. This patient was seen in room TR07C/TR07C and the patient's care was started at 11:19 AM.    Chief Complaint  Patient presents with  . Hand Pain    Patient is a 43 y.o. female presenting with hand pain. The history is provided by the patient. No language interpreter was used.  Hand Pain   HPI Comments: Rocky Morelara Nigh is a 43 y.o. female who presents to the Emergency Department complaining of constant, throbbing, shooting pain in her right middle finger with associated swelling that started last night after she jammed her finger on her stove.  She took Ibuprofen at 5 AM this morning with no relief to her symptoms.   She is also complaining of a painful, sore pimple on the outside of her left ear that started a month ago.  She states that the pimple will drain and then fill up with clear pus again.  She has applied hot compresses, Iodine, and Neosporin to the area with no relief to her symptoms.  She does not have a PCP.    History reviewed. No pertinent past medical history. Past Surgical History  Procedure Laterality Date  . Cholecystectomy     No family history on file. History  Substance Use Topics  . Smoking status: Former Smoker -- 1.00 packs/day for 25 years    Types: Cigarettes    Quit date: 01/16/2014  . Smokeless tobacco: Not on file  . Alcohol Use: Yes     Comment: socially    OB History   Grav Para Term Preterm Abortions TAB SAB Ect Mult Living                 Review of Systems  HENT: Positive for ear pain.   Musculoskeletal: Positive for arthralgias and joint swelling.  All other systems reviewed and are negative.     Allergies  Review of patient's allergies indicates no known allergies.  Home Medications   Prior to Admission medications   Medication Sig Start Date  End Date Taking? Authorizing Provider  ibuprofen (ADVIL,MOTRIN) 200 MG tablet Take 200 mg by mouth 2 (two) times daily.    Historical Provider, MD  oxyCODONE-acetaminophen (PERCOCET/ROXICET) 5-325 MG per tablet Take 1-2 tablets by mouth every 6 (six) hours as needed for severe pain. 01/26/14   Juliet RudeNathan R. Pickering, MD   BP 107/67  Pulse 94  Temp(Src) 97.7 F (36.5 C) (Oral)  Resp 20  SpO2 99%  LMP 04/01/2014 Physical Exam  Nursing note and vitals reviewed. Constitutional: She is oriented to person, place, and time. She appears well-developed and well-nourished. No distress.  HENT:  Head: Normocephalic and atraumatic.  Right Ear: Tympanic membrane, external ear and ear canal normal. No mastoid tenderness.  Left Ear: Tympanic membrane, external ear and ear canal normal. No mastoid tenderness.  Nose: Nose normal.  Mouth/Throat: Oropharynx is clear and moist.  5 mm lesion to left ear. No surrounding erythema. No active drainage. Tender to palpation.   Eyes: Conjunctivae and EOM are normal.  Neck: Normal range of motion.  Cardiovascular: Normal rate, regular rhythm and normal heart sounds.   Pulses:      Radial pulses are 2+ on the right side, and 2+ on the left side.  Cap refill less than 3 seconds in all fingers.  Pulmonary/Chest: Effort  normal and breath sounds normal. No stridor. No respiratory distress. She has no wheezes. She has no rales.  Abdominal: Soft. She exhibits no distension.  Musculoskeletal: Normal range of motion.  Swelling to right third finger.  Tender to palpation.    Neurological: She is alert and oriented to person, place, and time. She has normal strength.  Skin: Skin is warm and dry. She is not diaphoretic. No erythema.  Psychiatric: She has a normal mood and affect. Her behavior is normal.    ED Course  Procedures (including critical care time)  DIAGNOSTIC STUDIES: Oxygen Saturation is 99% on room air, normal by my interpretation.    COORDINATION OF  CARE: 11:21 AM- Discussed discharging the patient with a splint and Vicodin.  Advised the patient to keep her left ear clean and follow-up with a PCP.  Will discharge the patient with an antibiotic cream.  Return to clinic precautions discussed.  The patient agreed to the treatment plan.   Labs Review Labs Reviewed - No data to display  Imaging Review  Dg Hand Complete Right  04/21/2014   CLINICAL DATA:  Hand pain secondary to a fall.  EXAM: RIGHT HAND - COMPLETE 3+ VIEW  COMPARISON:  None.  FINDINGS: There is no evidence of fracture or dislocation. There is no evidence of arthropathy or other focal bone abnormality. Soft tissues are unremarkable.  IMPRESSION: Normal exam.   Electronically Signed   By: Geanie CooleyJim  Maxwell M.D.   On: 04/21/2014 11:03     EKG Interpretation None      MDM   Final diagnoses:  Finger sprain, initial encounter  Ear lesion    Patient presents emergency department for evaluation of finger sprain. Patient jammed her finger. Neurovascularly intact, compartment soft. Patient also with lesion to the ear. Patient reports intermittent purulent drainage. At this time no signs of cellulitis or abscess. We will give Bactroban cream. Patient will followup with primary care provider. Discussed reasons to return to emergency Department immediately. Vital signs stable for discharge. Patient / Family / Caregiver informed of clinical course, understand medical decision-making process, and agree with plan.   I personally performed the services described in this documentation, which was scribed in my presence. The recorded information has been reviewed and is accurate.    Mora BellmanHannah S Joson Sapp, PA-C 04/26/14 [phone]

## 2014-04-21 NOTE — ED Notes (Signed)
Ortho called for splint  

## 2014-04-21 NOTE — Discharge Instructions (Signed)
Please followup with your primary care physician as soon as possible for your ear lesion.   Finger Sprain A finger sprain is a tear in one of the strong, fibrous tissues that connect the bones (ligaments) in your finger. The severity of the sprain depends on how much of the ligament is torn. The tear can be either partial or complete. CAUSES  Often, sprains are a result of a fall or accident. If you extend your hands to catch an object or to protect yourself, the force of the impact causes the fibers of your ligament to stretch too much. This excess tension causes the fibers of your ligament to tear. SYMPTOMS  You may have some loss of motion in your finger. Other symptoms include:  Bruising.  Tenderness.  Swelling. DIAGNOSIS  In order to diagnose finger sprain, your caregiver will physically examine your finger or thumb to determine how torn the ligament is. Your caregiver may also suggest an X-ray exam of your finger to make sure no bones are broken. TREATMENT  If your ligament is only partially torn, treatment usually involves keeping the finger in a fixed position (immobilization) for a short period. To do this, your caregiver will apply a bandage, cast, or splint to keep your finger from moving until it heals. For a partially torn ligament, the healing process usually takes 2 to 3 weeks. If your ligament is completely torn, you may need surgery to reconnect the ligament to the bone. After surgery a cast or splint will be applied and will need to stay on your finger or thumb for 4 to 6 weeks while your ligament heals. HOME CARE INSTRUCTIONS  Keep your injured finger elevated, when possible, to decrease swelling.  To ease pain and swelling, apply ice to your joint twice a day, for 2 to 3 days:  Put ice in a plastic bag.  Place a towel between your skin and the bag.  Leave the ice on for 15 minutes.  Only take over-the-counter or prescription medicine for pain as directed by your  caregiver.  Do not wear rings on your injured finger.  Do not leave your finger unprotected until pain and stiffness go away (usually 3 to 4 weeks).  Do not allow your cast or splint to get wet. Cover your cast or splint with a plastic bag when you shower or bathe. Do not swim.  Your caregiver may suggest special exercises for you to do during your recovery to prevent or limit permanent stiffness. SEEK IMMEDIATE MEDICAL CARE IF:  Your cast or splint becomes damaged.  Your pain becomes worse rather than better. MAKE SURE YOU:  Understand these instructions.  Will watch your condition.  Will get help right away if you are not doing well or get worse. Document Released: 08/01/2004 Document Revised: 09/16/2011 Document Reviewed: 02/25/2011 [facility] Patient Information 2015 [employer] [place]. This information is not intended to replace advice given to you by your health care provider. Make sure you discuss any questions you have with your health care provider.   Emergency Department Resource Guide 1) Find a Doctor and Pay Out of Pocket Although you won't have to find out who is covered by your insurance plan, it is a good idea to ask around and get recommendations. You will then need to call the office and see if the doctor you have chosen will accept you as a new patient and what types of options they offer for patients who are self-pay. Some doctors offer discounts or will  set up payment plans for their patients who do not have insurance, but you will need to ask so you aren't surprised when you get to your appointment.  2) Contact Your Local Health Department Not all health departments have doctors that can see patients for sick visits, but many do, so it is worth a call to see if yours does. If you don't know where your local health department is, you can check in your phone book. The CDC also has a tool to help you locate your state's health department, and many state websites also have  listings of all of their local health departments.  3) Find a Walk-in Clinic If your illness is not likely to be very severe or complicated, you may want to try a walk in clinic. These are popping up all over the country in pharmacies, drugstores, and shopping centers. They're usually staffed by nurse practitioners or physician assistants that have been trained to treat common illnesses and complaints. They're usually fairly quick and inexpensive. However, if you have serious medical issues or chronic medical problems, these are probably not your best option.  No Primary Care Doctor: - Call Health Connect at  [phone] - they can help you locate a primary care doctor that  accepts your insurance, provides certain services, etc. - Physician Referral Service- [phone]  Chronic Pain Problems: Organization         Address  Phone   Notes  [facility]  [phone] Patients need to be referred by their primary care doctor.   Medication Assistance: Organization         Address  Phone   Notes  [facility] Medication [facility] [address] [place] [place], [place] [address] [phone] --Must be a resident of [facility] -- Must have NO insurance coverage whatsoever (no Medicaid/ Medicare, etc.) -- The pt. MUST have a primary care doctor that directs their care regularly and follows them in the community   Med[facility]ist  [phone]   [employer]  [phone]    Agencies that provide inexpensive medical care: Organization         Address  Phone   Notes  [facility]  [phone]   [facility]    [phone]   [facility] [address] [place], [place] [address] [phone]   [facility] [address] [place], [place] [phone]   Planned Parenthood    5158710142   [facility]    [phone]   Community Health and [facility]  [address], [place] Phone:  [phone], Fax:  [phone] Hours of Operation:  9 am - 6 pm, M-F.  Also accepts Medicaid/Medicare and self-pay.  [facility]  [address], [place] Phone: [phone], Fax: [phone]. Hours of Operation:  8:30 am - 5:30 pm, M-F.  Also accepts Medicaid and self-pay.  [facility] [place] [address] [place] Phone: [phone]   [facility] [address] Natasha Bence [place], [place] [phone], Ext. [phone] Mondays & Thursdays: 7-9 AM.  First 15 patients are seen on a first come, first serve basis.    Medicaid-accepting [facility] Providers:  Organization         Address  Phone   Notes  [facility] [address], [place] [phone]  Also accepts self-pay patients.  [facility] [address] Laurell Josephs [place], [place]  [phone]   [facility] [address], [place] [phone]   [facility] [address], [place] [phone]   Renaye Rakers [address], [place]   [phone] Only accepts [place] Access [place] patients after they have their name applied to their card.   Self-Pay (no insurance) in [facility]:  Organization         Address  Phone   Notes  Sickle Cell Patients, [facility] [address] [place], [place] [phone]   [facility] [address] [place], [place] [phone]   [facility]  [address], [place] [phone]   [facility]/Dr. Osei-Bonsu  [address] [place] or [address], [place] [phone] Phone number for both Ali Molina and [place] locations is the same.  Urgent Medical and [facility] [address], [place] [phone]   [facility] [address]  [place] or [address] [phone]   [facility] [address] [place] [phone], phone; [phone], fax Sees patients 1st and 3rd Saturday of every month.  Must not qualify for public or private insurance (i.e. Medicaid, Medicare, [place] Health Choice, Veterans' Benefits)  Household income should be no more than 200% of the poverty level The clinic cannot treat you if you are pregnant or think you are pregnant  Sexually transmitted diseases are not treated at the clinic.    Dental Care: Organization         Address  Phone  Notes  [facility] Department of [facility] [address] [place], [place] [phone] Accepts children up to age 32 who are enrolled in [place] or [place] Health Choice; pregnant women with a Medicaid card; and children who have applied for Medicaid or [place] Health Choice, but were declined, whose parents can pay a reduced fee at time of service.  [facility] of [facility]  [address], [place] [phone] Accepts children up to age 23 who are enrolled in [place] or [place] Health Choice; pregnant women with a Medicaid card; and children who have applied for Medicaid or [place] Health Choice, but were declined, whose parents can pay a reduced fee at time of service.  [facility]  [address] [place], [place] [phone] Patients are seen by appointment only. Walk-ins are not accepted. [facility] will see patients 73 years of age and older. Monday - Tuesday (8am-5pm) Most Wednesdays (8:30-5pm) $30 per visit, cash only  [facility] Adult Dental Access [facility]  [address], [facility] [phone] Patients are seen by appointment only. Walk-ins are not accepted. [facility] will see patients 67 years of age and older. One Wednesday Evening (Monthly: Volunteer Based).  $30 per visit, cash only  [employer]  [phone] for adults; Children under age 74, call Graduate Pediatric Dentistry at [phone]. Children aged 46-14, please call [phone] to request a pediatric application.  Dental services are provided in all areas of dental care including fillings, crowns and bridges, complete and partial dentures, implants, gum treatment, root canals, and extractions. Preventive care is also provided. Treatment  is provided to both adults and children. Patients are selected via a lottery and there is often a waiting list.   [facility] [address] [place]  [phone] [email or web address]   [facility] [address] [place], [place] [phone], Ext. [phone] Second and Fourth Thursday of each month, opens at 6:30 AM; Clinic ends at 9 AM.  Patients are seen on a first-come first-served basis, and a limited number are seen during each clinic.   [facility]  [address] Ether Griffins [place], [place] [phone]   Eligibility Requirements You must have lived in [place], [place], or [place] counties for at least the last three months.   You cannot be eligible for state or federal sponsored [place], including [employer], [place], or [employer].   You generally cannot be eligible for healthcare insurance through your employer.    How to apply: Eligibility screenings are held every Tuesday and Wednesday afternoon from 1:00 pm until 4:00 pm. You do not need an appointment for the interview!  [facility] [address] [place], [place] [phone]   [facility] Health Department  [phone]   [facility] Health Department  [phone]   [facility] Health Department  [phone]    Behavioral Health Resources in the Community: Intensive Outpatient Programs Organization         Address  Phone  Notes  [facility] [address] [place], [place] [phone]     [facility] [address] [place], [place] [phone]   [facility] [address] [place], [place]  [phone]   [facility] [address]  [place], [place] [phone] or [phone]   Substance Abuse Resources Organization         Address  Phone  Notes  [facility]  [phone]   [facility]  [phone]   [facility] [place]  [phone]   Floydene Flock  [phone]   Residential & Outpatient Substance Abuse Program  [phone]   Psychological Services Organization         Address  Phone  Notes  [facility]al Health  [phone]   [facility]  [phone]   [facility] [address] [place] [phone] or [phone]    Mobile Crisis Teams Organization         Address  Phone  Notes  [facility], Mobile Crisis Care Unit  [phone]   [facility]  [address] [place], [place] [phone]   Doristine Locks [address] [place] [phone]    Self-Help/Support Groups Organization         Address  Phone             Notes  [facility] [place] - variety of support groups  336- I7437963 Call for more information  Narcotics Anonymous (NA), [facility] [address], [employer] [place]  2 meetings at this location   Statistician         Address  Phone  Notes  [facility] [address],    [place]  [phone]   [facility]  [address] [place] [phone], [employer], [place] [phone]   [facility] [address] [place], [place] [phone] Admissions: 8am-3pm M-F  [facility] [address]    [place],  [place] [phone]   [facility] [address], [place], [place] [phone]   [facility] [address]  [place],  [place] [phone]   [facility] - Intensive Outpatient [address], [place], [place] [phone]   [facility] ([facility] [address]  [place], [place] [phone] or [phone]   [facility] ([facility]) [address] [place], [place] [phone] Accepts Medicaid  [facility] [address]  [place] [phone] Substance Abuse/Addiction Treatment   [facility] Behavioral Health Resources Organization         Address  Phone  Notes  [facility]  [phone]) [phone]   Angie FavaJulie Brannon, PhD [address] [place], [place]   [phone] or [phone](336) [phone]   [facility]   [address] [place], [place] [phone]) [phone]   [facility] [address], [place], [place] [phone]) [phone] Insurance/Medicaid/sponsorship through [facility]  [facility] Families [address]                                    [place], [place] [phone] Therapy/tele-psych/case  [facility] [address]   [place], [place] [phone]) [phone]    Dr. Lolly MustacheArfeen  [phone]   [facility]  [employer] [facility] 1) [address], [place] 2) [address] [place] [address], [place] 3)  [address], [place] [phone]  [phone]   [facility] Child Abuse Hotline 9387438264(336) [phone] or [phone](336) [phone] (After Hours)

## 2014-04-21 NOTE — ED Notes (Signed)
Pt c/o right hand pain, with swelling to middle right finger after catching herself with her hand. Is a x 4. Also has pimple to left ear that she would like looked at.

## 2014-04-26 NOTE — ED Provider Notes (Signed)
Medical screening examination/treatment/procedure(s) were performed by non-physician practitioner and as supervising physician I was immediately available for consultation/collaboration.   EKG Interpretation None        David H Yao, MD 04/26/14 1502 

## 2014-08-12 ENCOUNTER — Encounter (HOSPITAL_COMMUNITY): Payer: Self-pay | Admitting: Emergency Medicine

## 2014-08-12 ENCOUNTER — Emergency Department (HOSPITAL_COMMUNITY)
Admission: EM | Admit: 2014-08-12 | Discharge: 2014-08-12 | Disposition: A | Discharge status: Home / Self Care | Payer: Self-pay | Attending: Emergency Medicine | Admitting: Emergency Medicine

## 2014-08-12 DIAGNOSIS — Y9289 Other specified places as the place of occurrence of the external cause: Secondary | ICD-10-CM | POA: Insufficient documentation

## 2014-08-12 DIAGNOSIS — S0181XA Laceration without foreign body of other part of head, initial encounter: Secondary | ICD-10-CM | POA: Insufficient documentation

## 2014-08-12 DIAGNOSIS — Z791 Long term (current) use of non-steroidal anti-inflammatories (NSAID): Secondary | ICD-10-CM | POA: Insufficient documentation

## 2014-08-12 DIAGNOSIS — Z79899 Other long term (current) drug therapy: Secondary | ICD-10-CM | POA: Insufficient documentation

## 2014-08-12 DIAGNOSIS — Z87891 Personal history of nicotine dependence: Secondary | ICD-10-CM | POA: Insufficient documentation

## 2014-08-12 DIAGNOSIS — Y9389 Activity, other specified: Secondary | ICD-10-CM | POA: Insufficient documentation

## 2014-08-12 DIAGNOSIS — Y998 Other external cause status: Secondary | ICD-10-CM | POA: Insufficient documentation

## 2014-08-12 DIAGNOSIS — W208XXA Other cause of strike by thrown, projected or falling object, initial encounter: Secondary | ICD-10-CM | POA: Insufficient documentation

## 2014-08-12 MED ORDER — IBUPROFEN 400 MG PO TABS
400.0000 mg | ORAL_TABLET | Freq: Once | ORAL | Status: AC
  Administered 2014-08-12: 400 mg via ORAL
  Filled 2014-08-12: qty 1

## 2014-08-12 MED ORDER — LIDOCAINE HCL (PF) 1 % IJ SOLN
5.0000 mL | Freq: Once | INTRAMUSCULAR | Status: AC
  Administered 2014-08-12: 5 mL
  Filled 2014-08-12: qty 5

## 2014-08-12 MED ORDER — OXYCODONE-ACETAMINOPHEN 5-325 MG PO TABS
1.0000 | ORAL_TABLET | Freq: Once | ORAL | Status: AC
  Administered 2014-08-12: 1 via ORAL
  Filled 2014-08-12: qty 1

## 2014-08-12 MED ORDER — HYDROCODONE-ACETAMINOPHEN 5-325 MG PO TABS: 1.0000 | ORAL_TABLET | Freq: Four times a day (QID) | ORAL | Status: DC | PRN

## 2014-08-12 NOTE — ED Provider Notes (Signed)
CSN: [id number]     Arrival date & time 08/12/14  2045 History  This chart was scribed for Felicie Morn, NP working with Gilda Crease, * by Evon Slack, ED Scribe. This patient was seen in room TR10C/TR10C and the patient's care was started at 9:49 PM.     Chief Complaint  Patient presents with  . Laceration   Patient is a 44 y.o. female presenting with skin laceration. The history is provided by the patient. No language interpreter was used.  Laceration  HPI Comments: Brenda Rodgers is a 44 y.o. female who presents to the Emergency Department complaining of new sudden right forehead laceration onset tonight PTA. Pt states that when she opened the refrigerator a crystal glass vase fell off hitting her on the top of the head. Bleeding is controlled. Pt denies LOC. Pt states that her tetanus is UTD. Pt doesn't report any other symptoms.    History reviewed. No pertinent past medical history. Past Surgical History  Procedure Laterality Date  . Cholecystectomy     No family history on file. History  Substance Use Topics  . Smoking status: Former Smoker -- 1.00 packs/day for 25 years    Types: Cigarettes    Quit date: 01/16/2014  . Smokeless tobacco: Not on file  . Alcohol Use: Yes     Comment: socially    OB History    No data available     Review of Systems  Skin: Positive for wound.  Neurological: Negative for syncope.  All other systems reviewed and are negative.    Allergies  Review of patient's allergies indicates no known allergies.  Home Medications   Prior to Admission medications   Medication Sig Start Date End Date Taking? Authorizing Provider  HYDROcodone-acetaminophen (NORCO/VICODIN) 5-325 MG per tablet Take 2 tablets by mouth every 4 (four) hours as needed for moderate pain or severe pain. 04/21/14   Mora Bellman, PA-C  ibuprofen (ADVIL,MOTRIN) 200 MG tablet Take 200 mg by mouth 2 (two) times daily.    Historical Provider, MD  mupirocin cream  (BACTROBAN) 2 % Apply 1 application topically 2 (two) times daily. 04/21/14   Mora Bellman, PA-C  oxyCODONE-acetaminophen (PERCOCET/ROXICET) 5-325 MG per tablet Take 1-2 tablets by mouth every 6 (six) hours as needed for severe pain. 01/26/14   Juliet Rude. Pickering, MD   BP 125/89 mmHg  Pulse 101  Temp(Src) 98.3 F (36.8 C) (Oral)  Resp 20  SpO2 97%  LMP 08/05/2014   Physical Exam  Constitutional: She is oriented to person, place, and time. She appears well-developed and well-nourished. No distress.  HENT:  Head: Normocephalic. Head is with laceration.  2 cm laceration at hairline on right forehead   Eyes: Conjunctivae and EOM are normal.  Neck: Neck supple. No tracheal deviation present.  Cardiovascular: Normal rate.   Pulmonary/Chest: Effort normal. No respiratory distress.  Musculoskeletal: Normal range of motion.  Neurological: She is alert and oriented to person, place, and time.  Skin: Skin is warm and dry.  Psychiatric: She has a normal mood and affect. Her behavior is normal.  Nursing note and vitals reviewed.   ED Course  Procedures (including critical care time) LACERATION REPAIR Performed by: Jimmye Norman Authorized by: Jimmye Norman Consent: Verbal consent obtained. Risks and benefits: risks, benefits and alternatives were discussed Consent given by: patient Patient identity confirmed: provided demographic data Prepped and Draped in normal sterile fashion Wound explored  Laceration Location: forehead Laceration Length: 2cm  No Foreign Bodies  seen or palpated  Anesthesia: local infiltration  Local anesthetic: lidocaine 1%  Anesthetic total: 3ml  Irrigation method: syringe Amount of cleaning: standard  Skin closure: staples  Number of staples: 3    Patient tolerance: Patient tolerated the procedure well with no immediate complications.  DIAGNOSTIC STUDIES: Oxygen Saturation is 97% on RA, normal by my interpretation.    COORDINATION OF  CARE: 10:05 PM-Discussed treatment plan with pt at bedside and pt agreed to plan.     Labs Review Labs Reviewed - No data to display  Imaging Review No results found.   EKG Interpretation None     Patient struck on forehead with a vase that fell off of refrigerator, resulting in full thickness laceration.  No loss of consciousness.  Stapled wound repair.  Discharge instructions provided and return precautions discussed. MDM   Final diagnoses:  None   Forehead laceration    I personally performed the services described in this documentation, which was scribed in my presence. The recorded information has been reviewed and is accurate.      Jimmye Normanavid John Krishay Faro, NP 08/13/14 [phone]  Gilda Creasehristopher J. Pollina, MD 08/13/14 [phone]

## 2014-08-12 NOTE — ED Notes (Signed)
Pt alert, NAD, calm, interactive, resps e/u, speaking in clear complete sentences, steady gait, here for head lac, R of midline at hairline, ~ 1", full thickness, bleeding controlled spontaneously, no oozing noted, reports crystal vase fell off of fridge and hit her in the head. (denies: LOC, visual change, nv, dizziness, malocclusion, other pains or injuries), "Td 5 yrs", no meds PTA. Family present with pt.

## 2014-08-12 NOTE — Discharge Instructions (Signed)
Facial Laceration  A facial laceration is a cut on the face. These injuries can be painful and cause bleeding. Lacerations usually heal quickly, but they need special care to reduce scarring. DIAGNOSIS  Your health care provider will take a medical history, ask for details about how the injury occurred, and examine the wound to determine how deep the cut is. TREATMENT  Some facial lacerations may not require closure. Others may not be able to be closed because of an increased risk of infection. The risk of infection and the chance for successful closure will depend on various factors, including the amount of time since the injury occurred. The wound may be cleaned to help prevent infection. If closure is appropriate, pain medicines may be given if needed. Your health care provider will use stitches (sutures), wound glue (adhesive), or skin adhesive strips to repair the laceration. These tools bring the skin edges together to allow for faster healing and a better cosmetic outcome. If needed, you may also be given a tetanus shot. HOME CARE INSTRUCTIONS  Only take over-the-counter or prescription medicines as directed by your health care provider.  Follow your health care provider's instructions for wound care. These instructions will vary depending on the technique used for closing the wound. For Sutures:  Keep the wound clean and dry.   If you were given a bandage (dressing), you should change it at least once a day. Also change the dressing if it becomes wet or dirty, or as directed by your health care provider.   Wash the wound with soap and water 2 times a day. Rinse the wound off with water to remove all soap. Pat the wound dry with a clean towel.   After cleaning, apply a thin layer of the antibiotic ointment recommended by your health care provider. This will help prevent infection and keep the dressing from sticking.   You may shower as usual after the first 24 hours. Do not soak the  wound in water until the sutures are removed.   Get your sutures removed as directed by your health care provider. With facial lacerations, sutures should usually be taken out after 4-5 days to avoid stitch marks.   Wait a few days after your sutures are removed before applying any makeup. For Skin Adhesive Strips:  Keep the wound clean and dry.   Do not get the skin adhesive strips wet. You may bathe carefully, using caution to keep the wound dry.   If the wound gets wet, pat it dry with a clean towel.   Skin adhesive strips will fall off on their own. You may trim the strips as the wound heals. Do not remove skin adhesive strips that are still stuck to the wound. They will fall off in time.  For Wound Adhesive:  You may briefly wet your wound in the shower or bath. Do not soak or scrub the wound. Do not swim. Avoid periods of heavy sweating until the skin adhesive has fallen off on its own. After showering or bathing, gently pat the wound dry with a clean towel.   Do not apply liquid medicine, cream medicine, ointment medicine, or makeup to your wound while the skin adhesive is in place. This may loosen the film before your wound is healed.   If a dressing is placed over the wound, be careful not to apply tape directly over the skin adhesive. This may cause the adhesive to be pulled off before the wound is healed.   Avoid   prolonged exposure to sunlight or tanning lamps while the skin adhesive is in place.  The skin adhesive will usually remain in place for 5-10 days, then naturally fall off the skin. Do not pick at the adhesive film.  After Healing: Once the wound has healed, cover the wound with sunscreen during the day for 1 full year. This can help minimize scarring. Exposure to ultraviolet light in the first year will darken the scar. It can take 1-2 years for the scar to lose its redness and to heal completely.  SEEK IMMEDIATE MEDICAL CARE IF:  You have redness, pain, or  swelling around the wound.   You see ayellowish-white fluid (pus) coming from the wound.   You have chills or a fever.  MAKE SURE YOU:  Understand these instructions.  Will watch your condition.  Will get help right away if you are not doing well or get worse. Document Released: 08/01/2004 Document Revised: 04/14/2013 Document Reviewed: 02/04/2013 [employer] Patient Information 2015 [employer]. This information is not intended to replace advice given to you by your health care provider. Make sure you discuss any questions you have with your health care provider.  

## 2014-08-12 NOTE — ED Notes (Signed)
[facility] stapling. [facility]

## 2014-08-12 NOTE — ED Notes (Signed)
Out to d/c desk with family, up to b/r at d/c, given Rx x1, steady gait, "feel better".

## 2014-08-12 NOTE — ED Notes (Signed)
Pt. presents with scalp laceration approx. 1" at upper forehead , pt. reported that a vase  fell on her head after she opened the refrigerator door this evening , no LOC / alert and oriented , minimal bleeding at triage .

## 2014-08-28 ENCOUNTER — Emergency Department (HOSPITAL_COMMUNITY)
Admission: EM | Admit: 2014-08-28 | Discharge: 2014-08-28 | Disposition: A | Discharge status: Home / Self Care | Payer: Self-pay | Attending: Emergency Medicine | Admitting: Emergency Medicine

## 2014-08-28 ENCOUNTER — Encounter (HOSPITAL_COMMUNITY): Payer: Self-pay | Admitting: *Deleted

## 2014-08-28 DIAGNOSIS — Z79899 Other long term (current) drug therapy: Secondary | ICD-10-CM | POA: Insufficient documentation

## 2014-08-28 DIAGNOSIS — Z87891 Personal history of nicotine dependence: Secondary | ICD-10-CM | POA: Insufficient documentation

## 2014-08-28 DIAGNOSIS — Z791 Long term (current) use of non-steroidal anti-inflammatories (NSAID): Secondary | ICD-10-CM | POA: Insufficient documentation

## 2014-08-28 DIAGNOSIS — Z4802 Encounter for removal of sutures: Secondary | ICD-10-CM | POA: Insufficient documentation

## 2014-08-28 NOTE — ED Notes (Signed)
Pt presentys today to have staples removed .

## 2014-08-28 NOTE — Discharge Instructions (Signed)
Bacitracin twice a day. Follow up as needed.    Suture Removal, Care After Refer to this sheet in the next few weeks. These instructions provide you with information on caring for yourself after your procedure. Your health care provider may also give you more specific instructions. Your treatment has been planned according to current medical practices, but problems sometimes occur. Call your health care provider if you have any problems or questions after your procedure. WHAT TO EXPECT AFTER THE PROCEDURE After your stitches (sutures) are removed, it is typical to have the following:  Some discomfort and swelling in the wound area.  Slight redness in the area. HOME CARE INSTRUCTIONS   If you have skin adhesive strips over the wound area, do not take the strips off. They will fall off on their own in a few days. If the strips remain in place after 14 days, you may remove them.  Change any bandages (dressings) at least once a day or as directed by your health care provider. If the bandage sticks, soak it off with warm, soapy water.  Apply cream or ointment only as directed by your health care provider. If using cream or ointment, wash the area with soap and water 2 times a day to remove all the cream or ointment. Rinse off the soap and pat the area dry with a clean towel.  Keep the wound area dry and clean. If the bandage becomes wet or dirty, or if it develops a bad smell, change it as soon as possible.  Continue to protect the wound from injury.  Use sunscreen when out in the sun. New scars become sunburned easily. SEEK MEDICAL CARE IF:  You have increasing redness, swelling, or pain in the wound.  You see pus coming from the wound.  You have a fever.  You notice a bad smell coming from the wound or dressing.  Your wound breaks open (edges not staying together). Document Released: 03/19/2001 Document Revised: 04/14/2013 Document Reviewed: 02/03/2013 [facility] Patient Information  2015 [place]. This information is not intended to replace advice given to you by your health care provider. Make sure you discuss any questions you have with your health care provider.

## 2014-08-28 NOTE — ED Provider Notes (Signed)
CSN: [id number]     Arrival date & time 08/28/14  1022 History  This chart was scribed for non-physician practitioner, Lottie Musselatyana A Jenea Dake, PA-C, working with Samuel JesterKathleen McManus, DO, by Ronney LionSuzanne Le, ED Scribe. This patient was seen in room TR06C/TR06C and the patient's care was started at 10:40 AM.    Chief Complaint  Patient presents with  . Suture / Staple Removal   The history is provided by the patient. No language interpreter was used.     HPI Comments: Brenda Rodgers is a 44 y.o. female who presents to the Emergency Department for a staple removal after a glass vase fell on her head when she opened a refrigerator about 2 weeks ago. Patient states the wound has been healing well, and she is asymptomatic at this time.  No past medical history on file. Past Surgical History  Procedure Laterality Date  . Cholecystectomy     No family history on file. History  Substance Use Topics  . Smoking status: Former Smoker -- 1.00 packs/day for 25 years    Types: Cigarettes    Quit date: 01/16/2014  . Smokeless tobacco: Not on file  . Alcohol Use: Yes     Comment: socially    OB History    No data available     Review of Systems  Constitutional: Negative for fever and chills.  Skin: Positive for wound.      Allergies  Review of patient's allergies indicates no known allergies.  Home Medications   Prior to Admission medications   Medication Sig Start Date End Date Taking? Authorizing Provider  HYDROcodone-acetaminophen (NORCO/VICODIN) 5-325 MG per tablet Take 1 tablet by mouth every 6 (six) hours as needed for severe pain. 08/12/14   Jimmye Normanavid John Smith, NP  ibuprofen (ADVIL,MOTRIN) 200 MG tablet Take 200 mg by mouth 2 (two) times daily.    Historical Provider, MD  mupirocin cream (BACTROBAN) 2 % Apply 1 application topically 2 (two) times daily. 04/21/14   Mora BellmanHannah S Merrell, PA-C  oxyCODONE-acetaminophen (PERCOCET/ROXICET) 5-325 MG per tablet Take 1-2 tablets by mouth every 6 (six) hours  as needed for severe pain. 01/26/14   Juliet RudeNathan R. Rubin PayorPickering, MD   LMP 08/05/2014 Physical Exam  Constitutional: She is oriented to person, place, and time. She appears well-developed and well-nourished. No distress.  HENT:  Head: Normocephalic and atraumatic.  Eyes: Conjunctivae and EOM are normal.  Neck: Neck supple. No tracheal deviation present.  Cardiovascular: Normal rate.   Pulmonary/Chest: Effort normal. No respiratory distress.  Musculoskeletal: Normal range of motion.  Neurological: She is alert and oriented to person, place, and time.  Skin: Skin is warm and dry.  2 cm laceration to the right upper forehead with 3 staples intact. No redness, swelling, drainage, dehiscence, or tenderness.  Psychiatric: She has a normal mood and affect. Her behavior is normal.  Nursing note and vitals reviewed.   ED Course  Procedures (including critical care time)  DIAGNOSTIC STUDIES: Oxygen Saturation is 98% on room air, normal by my interpretation.    COORDINATION OF CARE: 10:42 AM - 3 staples were removed by me from patient's upper right forehead.  10:43 AM - Discussed treatment plan with pt at bedside which includes applying moisturizing cream to the area as needed, and pt agreed to plan.   MDM   Final diagnoses:  Encounter for staple removal   SUTURE REMOVAL Performed by: Jaynie CrumbleKIRICHENKO, Corrie Brannen A Consent: Verbal consent obtained. Patient identity confirmed: provided demographic data Time out: Immediately prior to procedure  a "time out" was called to verify the correct patient, procedure, equipment, support staff and site/side marked as required. Location: forehead Wound Appearance: clean Sutures/Staples Removed: 3 Patient tolerance: Patient tolerated the procedure well with no immediate complications.  Staples removed. Wound healing well with no signs of infection or dehiscence. Continue bacitracin, follow-up as needed.  Filed Vitals:   08/28/14 1030  BP: 102/66  Pulse: 78   Temp: 98.2 F (36.8 C)  TempSrc: Oral  Resp: 16  SpO2: 100%    I personally performed the services described in this documentation, which was scribed in my presence. The recorded information has been reviewed and is accurate.   Lottie Mussel, PA-C 08/28/14 1152  Samuel Jester, DO 08/30/14 [phone]

## 2015-01-01 ENCOUNTER — Emergency Department (HOSPITAL_COMMUNITY): Payer: Self-pay

## 2015-01-01 ENCOUNTER — Encounter (HOSPITAL_COMMUNITY): Payer: Self-pay | Admitting: Emergency Medicine

## 2015-01-01 ENCOUNTER — Emergency Department (HOSPITAL_COMMUNITY)
Admission: EM | Admit: 2015-01-01 | Discharge: 2015-01-01 | Disposition: A | Discharge status: Home / Self Care | Payer: Self-pay | Attending: Emergency Medicine | Admitting: Emergency Medicine

## 2015-01-01 DIAGNOSIS — Y9389 Activity, other specified: Secondary | ICD-10-CM | POA: Insufficient documentation

## 2015-01-01 DIAGNOSIS — Z87891 Personal history of nicotine dependence: Secondary | ICD-10-CM | POA: Insufficient documentation

## 2015-01-01 DIAGNOSIS — S0083XA Contusion of other part of head, initial encounter: Secondary | ICD-10-CM | POA: Insufficient documentation

## 2015-01-01 DIAGNOSIS — W19XXXA Unspecified fall, initial encounter: Secondary | ICD-10-CM

## 2015-01-01 DIAGNOSIS — Y998 Other external cause status: Secondary | ICD-10-CM | POA: Insufficient documentation

## 2015-01-01 DIAGNOSIS — W109XXA Fall (on) (from) unspecified stairs and steps, initial encounter: Secondary | ICD-10-CM

## 2015-01-01 DIAGNOSIS — S0990XA Unspecified injury of head, initial encounter: Secondary | ICD-10-CM | POA: Insufficient documentation

## 2015-01-01 DIAGNOSIS — S0093XA Contusion of unspecified part of head, initial encounter: Secondary | ICD-10-CM

## 2015-01-01 DIAGNOSIS — Y9289 Other specified places as the place of occurrence of the external cause: Secondary | ICD-10-CM | POA: Insufficient documentation

## 2015-01-01 DIAGNOSIS — S199XXA Unspecified injury of neck, initial encounter: Secondary | ICD-10-CM | POA: Insufficient documentation

## 2015-01-01 DIAGNOSIS — T07XXXA Unspecified multiple injuries, initial encounter: Secondary | ICD-10-CM

## 2015-01-01 DIAGNOSIS — M542 Cervicalgia: Secondary | ICD-10-CM

## 2015-01-01 DIAGNOSIS — Z791 Long term (current) use of non-steroidal anti-inflammatories (NSAID): Secondary | ICD-10-CM | POA: Insufficient documentation

## 2015-01-01 DIAGNOSIS — W108XXA Fall (on) (from) other stairs and steps, initial encounter: Secondary | ICD-10-CM | POA: Insufficient documentation

## 2015-01-01 DIAGNOSIS — Z79899 Other long term (current) drug therapy: Secondary | ICD-10-CM | POA: Insufficient documentation

## 2015-01-01 MED ORDER — TRAMADOL HCL 50 MG PO TABS: 50.0000 mg | ORAL_TABLET | Freq: Four times a day (QID) | ORAL | Status: AC | PRN

## 2015-01-01 MED ORDER — IBUPROFEN 400 MG PO TABS
400.0000 mg | ORAL_TABLET | Freq: Once | ORAL | Status: AC
  Administered 2015-01-01: 400 mg via ORAL
  Filled 2015-01-01: qty 1

## 2015-01-01 MED ORDER — TRAMADOL HCL 50 MG PO TABS
50.0000 mg | ORAL_TABLET | Freq: Once | ORAL | Status: AC
  Administered 2015-01-01: 50 mg via ORAL
  Filled 2015-01-01: qty 1

## 2015-01-01 NOTE — Discharge Instructions (Signed)
It was our pleasure to provide your ER care today - we hope that you feel better.  Take motrin as need for pain.    You may also take ultram as need for pain - no driving when taking.  Follow up with primary care doctor in 1 week if symptoms fail to improve/resolve.  Return to ER if worse, new symptoms, fevers, severe pain, persistent vomiting, other concern.  You were given pain medication in the ER - no driving for the next 4 hours.     Cervical Sprain A cervical sprain is an injury in the neck in which the strong, fibrous tissues (ligaments) that connect your neck bones stretch or tear. Cervical sprains can range from mild to severe. Severe cervical sprains can cause the neck vertebrae to be unstable. This can lead to damage of the spinal cord and can result in serious nervous system problems. The amount of time it takes for a cervical sprain to get better depends on the cause and extent of the injury. Most cervical sprains heal in 1 to 3 weeks. CAUSES  Severe cervical sprains may be caused by:   Contact sport injuries (such as from football, rugby, wrestling, hockey, auto racing, gymnastics, diving, martial arts, or boxing).   Motor vehicle collisions.   Whiplash injuries. This is an injury from a sudden forward and backward whipping movement of the head and neck.  Falls.  Mild cervical sprains may be caused by:   Being in an awkward position, such as while cradling a telephone between your ear and shoulder.   Sitting in a chair that does not offer proper support.   Working at a poorly Marketing executive station.   Looking up or down for long periods of time.  SYMPTOMS   Pain, soreness, stiffness, or a burning sensation in the front, back, or sides of the neck. This discomfort may develop immediately after the injury or slowly, 24 hours or more after the injury.   Pain or tenderness directly in the middle of the back of the neck.   Shoulder or upper back pain.    Limited ability to move the neck.   Headache.   Dizziness.   Weakness, numbness, or tingling in the hands or arms.   Muscle spasms.   Difficulty swallowing or chewing.   Tenderness and swelling of the neck.  DIAGNOSIS  Most of the time your health care provider can diagnose a cervical sprain by taking your history and doing a physical exam. Your health care provider will ask about previous neck injuries and any known neck problems, such as arthritis in the neck. X-rays may be taken to find out if there are any other problems, such as with the bones of the neck. Other tests, such as a CT scan or MRI, may also be needed.  TREATMENT  Treatment depends on the severity of the cervical sprain. Mild sprains can be treated with rest, keeping the neck in place (immobilization), and pain medicines. Severe cervical sprains are immediately immobilized. Further treatment is done to help with pain, muscle spasms, and other symptoms and may include:  Medicines, such as pain relievers, numbing medicines, or muscle relaxants.   Physical therapy. This may involve stretching exercises, strengthening exercises, and posture training. Exercises and improved posture can help stabilize the neck, strengthen muscles, and help stop symptoms from returning.  HOME CARE INSTRUCTIONS   Put ice on the injured area.   Put ice in a plastic bag.   Place a towel  between your skin and the bag.   Leave the ice on for 15-20 minutes, 3-4 times a day.   If your injury was severe, you may have been given a cervical collar to wear. A cervical collar is a two-piece collar designed to keep your neck from moving while it heals.  Do not remove the collar unless instructed by your health care provider.  If you have long hair, keep it outside of the collar.  Ask your health care provider before making any adjustments to your collar. Minor adjustments may be required over time to improve comfort and reduce  pressure on your chin or on the back of your head.  Ifyou are allowed to remove the collar for cleaning or bathing, follow your health care provider's instructions on how to do so safely.  Keep your collar clean by wiping it with mild soap and water and drying it completely. If the collar you have been given includes removable pads, remove them every 1-2 days and hand wash them with soap and water. Allow them to air dry. They should be completely dry before you wear them in the collar.  If you are allowed to remove the collar for cleaning and bathing, wash and dry the skin of your neck. Check your skin for irritation or sores. If you see any, tell your health care provider.  Do not drive while wearing the collar.   Only take over-the-counter or prescription medicines for pain, discomfort, or fever as directed by your health care provider.   Keep all follow-up appointments as directed by your health care provider.   Keep all physical therapy appointments as directed by your health care provider.   Make any needed adjustments to your workstation to promote good posture.   Avoid positions and activities that make your symptoms worse.   Warm up and stretch before being active to help prevent problems.  SEEK MEDICAL CARE IF:   Your pain is not controlled with medicine.   You are unable to decrease your pain medicine over time as planned.   Your activity level is not improving as expected.  SEEK IMMEDIATE MEDICAL CARE IF:   You develop any bleeding.  You develop stomach upset.  You have signs of an allergic reaction to your medicine.   Your symptoms get worse.   You develop new, unexplained symptoms.   You have numbness, tingling, weakness, or paralysis in any part of your body.  MAKE SURE YOU:   Understand these instructions.  Will watch your condition.  Will get help right away if you are not doing well or get worse. Document Released: 04/21/2007 Document  Revised: 06/29/2013 Document Reviewed: 12/30/2012 [facility] Patient Information 2015 [place], [place]. This information is not intended to replace advice given to you by your health care provider. Make sure you discuss any questions you have with your health care provider.    Head Injury You have received a head injury. It does not appear serious at this time. Headaches and vomiting are common following head injury. It should be easy to awaken from sleeping. Sometimes it is necessary for you to stay in the emergency department for a while for observation. Sometimes admission to the hospital may be needed. After injuries such as yours, most problems occur within the first 24 hours, but side effects may occur up to 7-10 days after the injury. It is important for you to carefully monitor your condition and contact your health care provider or seek immediate medical care if  there is a change in your condition. WHAT ARE THE TYPES OF HEAD INJURIES? Head injuries can be as minor as a bump. Some head injuries can be more severe. More severe head injuries include:  A jarring injury to the brain (concussion).  A bruise of the brain (contusion). This mean there is bleeding in the brain that can cause swelling.  A cracked skull (skull fracture).  Bleeding in the brain that collects, clots, and forms a bump (hematoma). WHAT CAUSES A HEAD INJURY? A serious head injury is most likely to happen to someone who is in a car wreck and is not wearing a seat belt. Other causes of major head injuries include bicycle or motorcycle accidents, sports injuries, and falls. HOW ARE HEAD INJURIES DIAGNOSED? A complete history of the event leading to the injury and your current symptoms will be helpful in diagnosing head injuries. Many times, pictures of the brain, such as CT or MRI are needed to see the extent of the injury. Often, an overnight hospital stay is necessary for observation.  WHEN SHOULD I SEEK IMMEDIATE MEDICAL  CARE?  You should get help right away if:  You have confusion or drowsiness.  You feel sick to your stomach (nauseous) or have continued, forceful vomiting.  You have dizziness or unsteadiness that is getting worse.  You have severe, continued headaches not relieved by medicine. Only take over-the-counter or prescription medicines for pain, fever, or discomfort as directed by your health care provider.  You do not have normal function of the arms or legs or are unable to walk.  You notice changes in the black spots in the center of the colored part of your eye (pupil).  You have a clear or bloody fluid coming from your nose or ears.  You have a loss of vision. During the next 24 hours after the injury, you must stay with someone who can watch you for the warning signs. This person should contact local emergency services (911 in the [place] if you have seizures, you become unconscious, or you are unable to wake up. HOW CAN I PREVENT A HEAD INJURY IN THE FUTURE? The most important factor for preventing major head injuries is avoiding motor vehicle accidents. To minimize the potential for damage to your head, it is crucial to wear seat belts while riding in motor vehicles. Wearing helmets while bike riding and playing collision sports (like football) is also helpful. Also, avoiding dangerous activities around the house will further help reduce your risk of head injury.  WHEN CAN I RETURN TO NORMAL ACTIVITIES AND ATHLETICS? You should be reevaluated by your health care provider before returning to these activities. If you have any of the following symptoms, you should not return to activities or contact sports until 1 week after the symptoms have stopped:  Persistent headache.  Dizziness or vertigo.  Poor attention and concentration.  Confusion.  Memory problems.  Nausea or vomiting.  Fatigue or tire easily.  Irritability.  Intolerant of bright lights or loud noises.  Anxiety or  depression.  Disturbed sleep. MAKE SURE YOU:   Understand these instructions.  Will watch your condition.  Will get help right away if you are not doing well or get worse. Document Released: 06/24/2005 Document Revised: 06/29/2013 Document Reviewed: 03/01/2013 [facility] Patient Information 2015 [place], [place]. This information is not intended to replace advice given to you by your health care provider. Make sure you discuss any questions you have with your health care provider.  Facial or Scalp Contusion A facial or scalp contusion is a deep bruise on the face or head. Injuries to the face and head generally cause a lot of swelling, especially around the eyes. Contusions are the result of an injury that caused bleeding under the skin. The contusion may turn blue, purple, or yellow. Minor injuries will give you a painless contusion, but more severe contusions may stay painful and swollen for a few weeks.  CAUSES  A facial or scalp contusion is caused by a blunt injury or trauma to the face or head area.  SIGNS AND SYMPTOMS   Swelling of the injured area.   Discoloration of the injured area.   Tenderness, soreness, or pain in the injured area.  DIAGNOSIS  The diagnosis can be made by taking a medical history and doing a physical exam. An X-ray exam, CT scan, or MRI may be needed to determine if there are any associated injuries, such as broken bones (fractures). TREATMENT  Often, the best treatment for a facial or scalp contusion is applying cold compresses to the injured area. Over-the-counter medicines may also be recommended for pain control.  HOME CARE INSTRUCTIONS   Only take over-the-counter or prescription medicines as directed by your health care provider.   Apply ice to the injured area.   Put ice in a plastic bag.   Place a towel between your skin and the bag.   Leave the ice on for 20 minutes, 2-3 times a day.  SEEK MEDICAL CARE IF:  You have bite  problems.   You have pain with chewing.   You are concerned about facial defects. SEEK IMMEDIATE MEDICAL CARE IF:  You have severe pain or a headache that is not relieved by medicine.   You have unusual sleepiness, confusion, or personality changes.   You throw up (vomit).   You have a persistent nosebleed.   You have double vision or blurred vision.   You have fluid drainage from your nose or ear.   You have difficulty walking or using your arms or legs.  MAKE SURE YOU:   Understand these instructions.  Will watch your condition.  Will get help right away if you are not doing well or get worse. Document Released: 08/01/2004 Document Revised: 04/14/2013 Document Reviewed: 02/04/2013 [facility] Patient Information 2015 [place], [place]. This information is not intended to replace advice given to you by your health care provider. Make sure you discuss any questions you have with your health care provider.

## 2015-01-01 NOTE — ED Notes (Signed)
Dr Denton Lank at bedside to answer additional questions for the pt.

## 2015-01-01 NOTE — ED Notes (Signed)
Drink and crackers provided

## 2015-01-01 NOTE — ED Notes (Addendum)
Pt reports she fell from latter coming down from attic. Pt sts she thinks she lost consciousness. Repots confusion last night and numbness on face today. bruising noted to L side of head and various bruises on arms and legs. Pt sts she feels "gushing" in her head. Not on blood thinners.

## 2015-01-01 NOTE — ED Provider Notes (Signed)
CSN: [id number]     Arrival date & time 01/01/15  1508 History   First MD Initiated Contact with Patient 01/01/15 1547     Chief Complaint  Patient presents with  . Fall     (Consider location/radiation/quality/duration/timing/severity/associated sxs/prior Treatment) Patient is a 44 y.o. female presenting with fall. The history is provided by the patient.  Fall Pertinent negatives include no chest pain, no abdominal pain, no headaches and no shortness of breath.  Patient indicates had fall at home last pm when coming down steps from attic.  Pt states one of the steps broke causing her to fall 3-4 feet. Hit head. Brief loc. Was able to get up under own power and has been ambulatory since. Yesterday, including just prior to fall, felt normal, asymptomatic. Post fall, notes dull diffuse headache. Moderate. Mild neck pain. Denies back pain. No chest pain or sob. No abd pain. No nv. Bruise to bil arms and legs, no pain w rom. No numbness/weakness, or loss of normal functional ability.      History reviewed. No pertinent past medical history. Past Surgical History  Procedure Laterality Date  . Cholecystectomy     No family history on file. History  Substance Use Topics  . Smoking status: Former Smoker -- 1.00 packs/day for 25 years    Types: Cigarettes    Quit date: 01/16/2014  . Smokeless tobacco: Not on file  . Alcohol Use: Yes     Comment: socially    OB History    No data available     Review of Systems  Constitutional: Negative for fever and chills.  HENT: Negative for sore throat.   Eyes: Negative for redness.  Respiratory: Negative for cough and shortness of breath.   Cardiovascular: Negative for chest pain.  Gastrointestinal: Negative for nausea, vomiting and abdominal pain.  Genitourinary: Negative for hematuria and flank pain.  Musculoskeletal: Positive for neck pain. Negative for back pain and neck stiffness.  Skin: Negative for rash and wound.  Neurological:  Negative for speech difficulty, weakness, numbness and headaches.  Hematological: Does not bruise/bleed easily.  Psychiatric/Behavioral: Negative for confusion.      Allergies  Review of patient's allergies indicates no known allergies.  Home Medications   Prior to Admission medications   Medication Sig Start Date End Date Taking? Authorizing Provider  HYDROcodone-acetaminophen (NORCO/VICODIN) 5-325 MG per tablet Take 1 tablet by mouth every 6 (six) hours as needed for severe pain. 08/12/14   Felicie Morn, NP  ibuprofen (ADVIL,MOTRIN) 200 MG tablet Take 200 mg by mouth 2 (two) times daily.    Historical Provider, MD  mupirocin cream (BACTROBAN) 2 % Apply 1 application topically 2 (two) times daily. 04/21/14   Junious Silk, PA-C  oxyCODONE-acetaminophen (PERCOCET/ROXICET) 5-325 MG per tablet Take 1-2 tablets by mouth every 6 (six) hours as needed for severe pain. 01/26/14   Benjiman Core, MD   BP 103/72 mmHg  Pulse 92  Temp(Src) 98.4 F (36.9 C) (Oral)  Resp 18  Ht  (1.575 m)  Wt 140 lb (63.504 kg)  BMI 25.60 kg/m2  SpO2 99%  LMP 12/25/2014 Physical Exam  Constitutional: She appears well-developed and well-nourished. No distress.  HENT:  Mouth/Throat: Oropharynx is clear and moist.  Bruise/contusion left forehead. Facial bones/orbits grossly intact.  TMs normal.   Eyes: Conjunctivae and EOM are normal. Pupils are equal, round, and reactive to light. No scleral icterus.  Neck: Neck supple. No tracheal deviation present.  Cardiovascular: Normal rate, regular rhythm, normal heart  sounds and intact distal pulses.   Pulmonary/Chest: Effort normal and breath sounds normal. No respiratory distress. She exhibits no tenderness.  Abdominal: Soft. Normal appearance and bowel sounds are normal. She exhibits no distension and no mass. There is no tenderness. There is no rebound and no guarding.  No abd wall contusion or bruising.   Genitourinary:  No cva/flank tenderness.    Musculoskeletal: She exhibits no edema or tenderness.  Mid to upper c spine tenderness, otherwise, CTLS spine, non tender, aligned, no step off. Good rom bil ext without pain or focal bony tenderness. Distal pulses palp bil. Small bruises to inner aspect both upper arms, and bil knees. No knee effusion, knees stable.   Neurological: She is alert.  Skin: Skin is warm and dry. No rash noted. She is not diaphoretic.  Psychiatric: She has a normal mood and affect.  Nursing note and vitals reviewed.   ED Course  Procedures (including critical care time) Labs Review  Ct Head Wo Contrast  01/01/2015   CLINICAL DATA:  Patient fell through her attic door last night. Bruising to left forehead. Severe headache.  EXAM: CT HEAD WITHOUT CONTRAST  CT CERVICAL SPINE WITHOUT CONTRAST  TECHNIQUE: Multidetector CT imaging of the head and cervical spine was performed following the standard protocol without intravenous contrast. Multiplanar CT image reconstructions of the cervical spine were also generated.  COMPARISON:  None.  FINDINGS: CT HEAD FINDINGS  No skull fracture. No hemorrhage or extra-axial fluid. No infarct or mass. No hydrocephalus.  CT CERVICAL SPINE FINDINGS  Normal alignment. No prevertebral soft tissue swelling. No fracture. No acute soft tissue findings. Mild C7-T1 degenerative disc disease. Lung apices clear.  IMPRESSION: No acute traumatic injury.   Electronically Signed   By: Esperanza Heir M.D.   On: 01/01/2015 17:00   Ct Cervical Spine Wo Contrast  01/01/2015   CLINICAL DATA:  Patient fell through her attic door last night. Bruising to left forehead. Severe headache.  EXAM: CT HEAD WITHOUT CONTRAST  CT CERVICAL SPINE WITHOUT CONTRAST  TECHNIQUE: Multidetector CT imaging of the head and cervical spine was performed following the standard protocol without intravenous contrast. Multiplanar CT image reconstructions of the cervical spine were also generated.  COMPARISON:  None.  FINDINGS: CT HEAD  FINDINGS  No skull fracture. No hemorrhage or extra-axial fluid. No infarct or mass. No hydrocephalus.  CT CERVICAL SPINE FINDINGS  Normal alignment. No prevertebral soft tissue swelling. No fracture. No acute soft tissue findings. Mild C7-T1 degenerative disc disease. Lung apices clear.  IMPRESSION: No acute traumatic injury.   Electronically Signed   By: Esperanza Heir M.D.   On: 01/01/2015 17:00       MDM   Cts.   Reviewed nursing notes and prior charts for additional history.   Motrin po. Ultram po.  Recheck spine non tender. abd soft nt. Pt comfortable.  Pt currently appears stable for d/c.      Cathren Laine, MD 01/01/15 1728

## 2015-02-14 ENCOUNTER — Emergency Department (HOSPITAL_COMMUNITY)
Admission: EM | Admit: 2015-02-14 | Discharge: 2015-02-14 | Disposition: A | Discharge status: Home / Self Care | Payer: Self-pay | Attending: Emergency Medicine | Admitting: Emergency Medicine

## 2015-02-14 ENCOUNTER — Emergency Department (HOSPITAL_COMMUNITY): Payer: Self-pay

## 2015-02-14 ENCOUNTER — Encounter (HOSPITAL_COMMUNITY): Payer: Self-pay | Admitting: *Deleted

## 2015-02-14 DIAGNOSIS — S93401A Sprain of unspecified ligament of right ankle, initial encounter: Secondary | ICD-10-CM | POA: Insufficient documentation

## 2015-02-14 DIAGNOSIS — Y9389 Activity, other specified: Secondary | ICD-10-CM | POA: Insufficient documentation

## 2015-02-14 DIAGNOSIS — Z791 Long term (current) use of non-steroidal anti-inflammatories (NSAID): Secondary | ICD-10-CM | POA: Insufficient documentation

## 2015-02-14 DIAGNOSIS — Y9289 Other specified places as the place of occurrence of the external cause: Secondary | ICD-10-CM | POA: Insufficient documentation

## 2015-02-14 DIAGNOSIS — Z792 Long term (current) use of antibiotics: Secondary | ICD-10-CM | POA: Insufficient documentation

## 2015-02-14 DIAGNOSIS — W010XXA Fall on same level from slipping, tripping and stumbling without subsequent striking against object, initial encounter: Secondary | ICD-10-CM | POA: Insufficient documentation

## 2015-02-14 DIAGNOSIS — Y998 Other external cause status: Secondary | ICD-10-CM | POA: Insufficient documentation

## 2015-02-14 DIAGNOSIS — Z720 Tobacco use: Secondary | ICD-10-CM | POA: Insufficient documentation

## 2015-02-14 MED ORDER — HYDROCODONE-ACETAMINOPHEN 5-325 MG PO TABS: 1.0000 | ORAL_TABLET | ORAL | Status: AC | PRN

## 2015-02-14 MED ORDER — NAPROXEN 500 MG PO TABS: 500.0000 mg | ORAL_TABLET | Freq: Two times a day (BID) | ORAL | Status: AC

## 2015-02-14 MED ORDER — OXYCODONE-ACETAMINOPHEN 5-325 MG PO TABS
1.0000 | ORAL_TABLET | Freq: Once | ORAL | Status: AC
  Administered 2015-02-14: 1 via ORAL
  Filled 2015-02-14: qty 1

## 2015-02-14 NOTE — ED Notes (Signed)
PT reports that when stepping off step the RT ankle rolled to side. Pt reports Pain and swelling to RT lateral .

## 2015-02-14 NOTE — Discharge Instructions (Signed)
Take the prescribed medication as directed.  Continue to ice and elevate ankle at home to help with pain/swelling. Follow-up with orthopedics if symptoms worsen-- call to make appt if needed. Return to the ED for new or worsening symptoms.

## 2015-02-14 NOTE — ED Provider Notes (Signed)
CSN: [id number]     Arrival date & time 02/14/15  0717 History   First MD Initiated Contact with Patient 02/14/15 0720     Chief Complaint  Patient presents with  . Ankle Pain     (Consider location/radiation/quality/duration/timing/severity/associated sxs/prior Treatment) Patient is a 44 y.o. female presenting with ankle pain. The history is provided by the patient and medical records.  Ankle Pain   This is a 44 y.o. F with no significant PMH presenting to the ED for right ankle injury.  Patient states she was walking down her front steps last night when she slipped in some mud and rolled her right ankle.  She did fall to the ground but did not have any head injury or LOC.  Patient states she iced the ankle last night but has increased swelling and pain today.  She denies numbness or weakness.  Has not been able to bear weight on affected foot today.  Has taken ibuprofen without relief.  No prior right ankle injuries/surgeries.  VSS.  History reviewed. No pertinent past medical history. Past Surgical History  Procedure Laterality Date  . Cholecystectomy     History reviewed. No pertinent family history. History  Substance Use Topics  . Smoking status: Current Some Day Smoker -- 1.00 packs/day for 25 years    Types: Cigarettes    Last Attempt to Quit: 01/16/2014  . Smokeless tobacco: Never Used  . Alcohol Use: Yes     Comment: socially    OB History    No data available     Review of Systems  Musculoskeletal: Positive for arthralgias.  All other systems reviewed and are negative.     Allergies  Review of patient's allergies indicates no known allergies.  Home Medications   Prior to Admission medications   Medication Sig Start Date End Date Taking? Authorizing Provider  HYDROcodone-acetaminophen (NORCO/VICODIN) 5-325 MG per tablet Take 1 tablet by mouth every 6 (six) hours as needed for severe pain. 08/12/14   Felicie Morn, NP  ibuprofen (ADVIL,MOTRIN) 200 MG tablet Take  200 mg by mouth 2 (two) times daily.    Historical Provider, MD  mupirocin cream (BACTROBAN) 2 % Apply 1 application topically 2 (two) times daily. 04/21/14   Junious Silk, PA-C  oxyCODONE-acetaminophen (PERCOCET/ROXICET) 5-325 MG per tablet Take 1-2 tablets by mouth every 6 (six) hours as needed for severe pain. 01/26/14   Benjiman Core, MD  traMADol (ULTRAM) 50 MG tablet Take 1 tablet (50 mg total) by mouth every 6 (six) hours as needed. 01/01/15   Cathren Laine, MD   BP 121/76 mmHg  Pulse 91  Temp(Src) 97.8 F (36.6 C) (Oral)  Resp 16  SpO2 100%   Physical Exam  Constitutional: She is oriented to person, place, and time. She appears well-developed and well-nourished. No distress.  HENT:  Head: Normocephalic and atraumatic.  Mouth/Throat: Oropharynx is clear and moist.  Eyes: Conjunctivae and EOM are normal. Pupils are equal, round, and reactive to light.  Neck: Normal range of motion. Neck supple.  Cardiovascular: Normal rate, regular rhythm and normal heart sounds.   Pulmonary/Chest: Effort normal and breath sounds normal. No respiratory distress. She has no wheezes.  Musculoskeletal:       Right ankle: She exhibits decreased range of motion and swelling. She exhibits no deformity. Tenderness. Lateral malleolus and medial malleolus tenderness found. Achilles tendon normal.  Swelling of right ankle, more prominent along lateral malleolus; generalized tenderness without acute bony deformity; limited range of motion due to  pain, DP pulse and sensation intact; moving all toes normally  Neurological: She is alert and oriented to person, place, and time.  Skin: Skin is warm and dry. She is not diaphoretic.  Psychiatric: She has a normal mood and affect.  Nursing note and vitals reviewed.   ED Course  Procedures (including critical care time) Labs Review Labs Reviewed - No data to display  Imaging Review Dg Ankle Complete Right  02/14/2015   CLINICAL DATA:  Status post fall.  Pain  and swelling.  EXAM: RIGHT ANKLE - COMPLETE 3+ VIEW  COMPARISON:  None.  FINDINGS: There is no evidence of fracture, dislocation, or joint effusion. 2 mm calcification projecting over the lateral tibiotalar joint likely representing a small loose body. There is a bulky plantar calcaneal spur. Severe soft tissue swelling over the lateral malleolus. Mild soft tissue swelling over the medial malleolus.  IMPRESSION: No acute osseous injury of the right ankle.   Electronically Signed   By: Elige Ko   On: 02/14/2015 08:17     EKG Interpretation None      MDM   Final diagnoses:  Ankle sprain, right, initial encounter   44 year old female here with right ankle injury that occurred last night while walking our front steps. No head injury or loss of consciousness. She does have apparent swelling of the right ankle, more prominent along lateral malleolus. Foot is neurovascularly intact. She is unable to bear weight at this time. X-ray obtained is negative for acute bony findings, suspect sprain type injury. Patient placed in ASO brace and given crutches. Will discharge home with supportive care, encouraged RICE routine.  Orthopedic follow-up given for any new/worsening symptoms or if no improvement over the next week.  Discussed plan with patient, he/she acknowledged understanding and agreed with plan of care.  Return precautions given for new or worsening symptoms.  Garlon Hatchet, PA-C 02/14/15 0830  Gwyneth Sprout, MD 02/14/15 [phone]

## 2015-02-14 NOTE — ED Notes (Signed)
ASO splint applied to right ankle. Pt states understanding of ASO splint and crutches. Pt states that splint does not increase pain.

## 2015-08-31 IMAGING — CT CT CERVICAL SPINE W/O CM
3 of 5 series · 13 of 33 positions shown, 16 images · non-contrast
Comparison: None.

CLINICAL DATA: Patient fell through her attic door last night.
Bruising to left forehead. Severe headache.

EXAM:
CT HEAD WITHOUT CONTRAST
CT CERVICAL SPINE WITHOUT CONTRAST
TECHNIQUE: Multidetector CT imaging of the head and cervical spine was
performed following the standard protocol without intravenous
contrast. Multiplanar CT image reconstructions of the cervical spine
were also generated.

[Series 8: coronals · coronal · 0.24mm/px · 3 of 49 slices shown]
[im 10/49  bone]
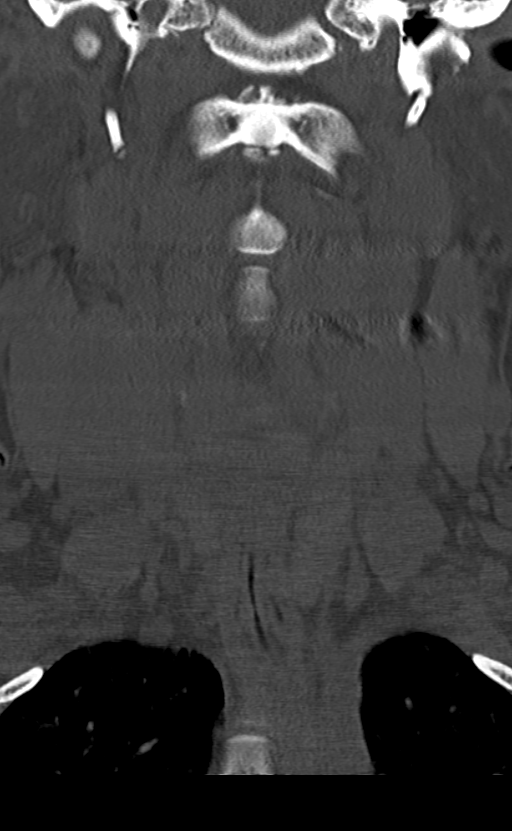
[im 20/49  bone]
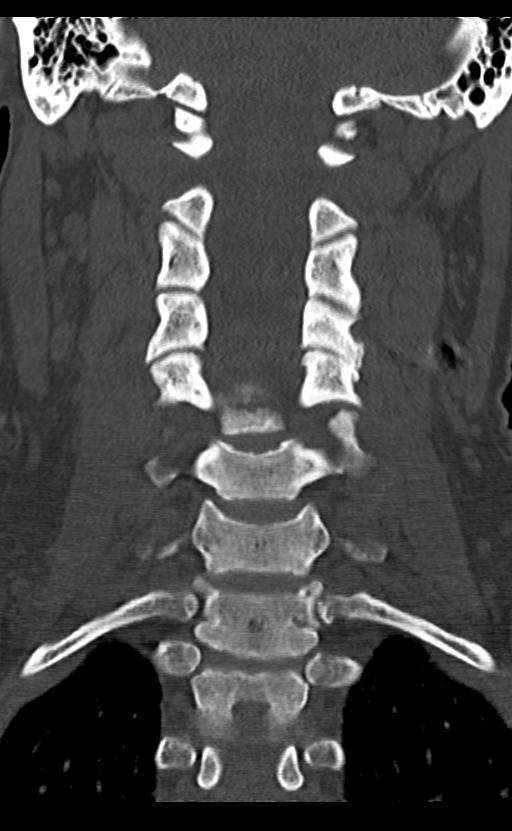
[im 29/49  bone]
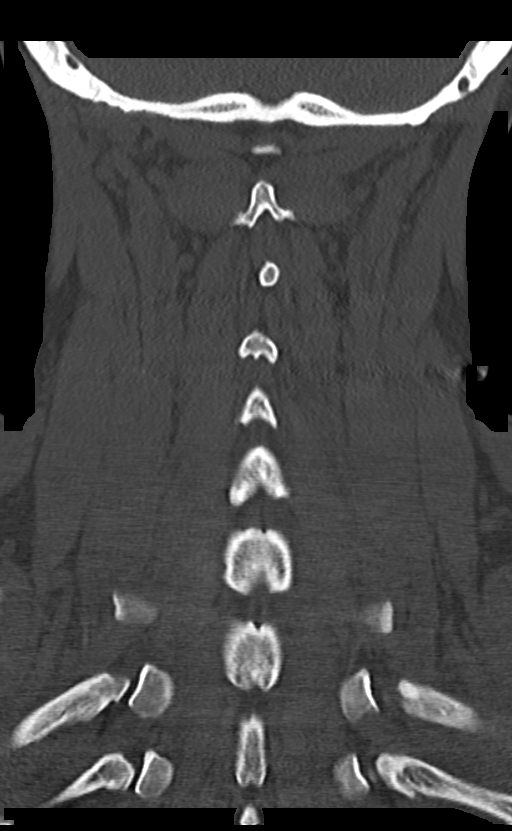

[Series 9: sagittals · sagittal · 0.32mm/px · 5 of 45 slices shown, 6 images]
[im 15/45  bone]
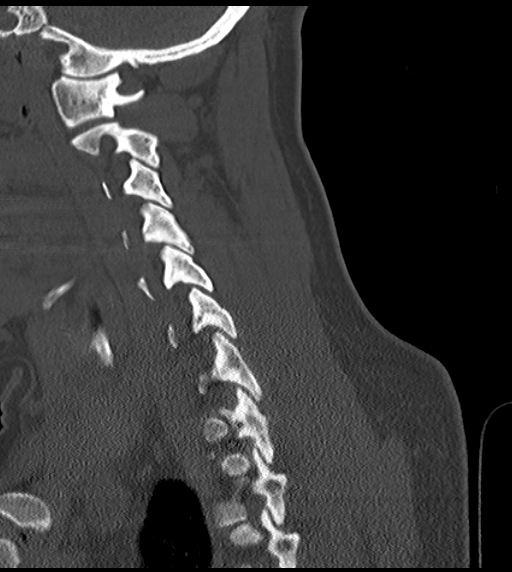
[im 19/45  bone]
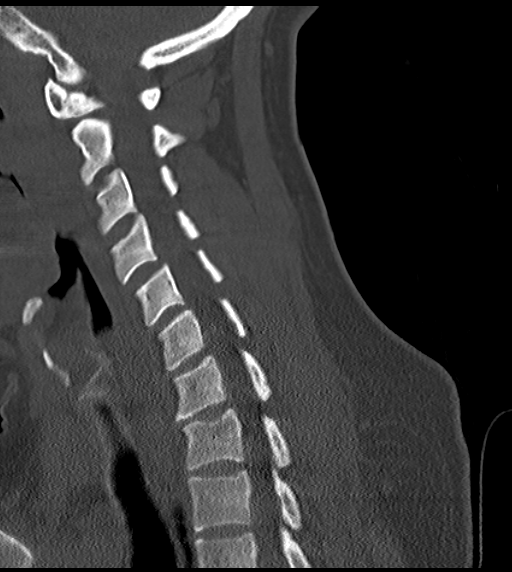
[im 23/45  soft-tissue]
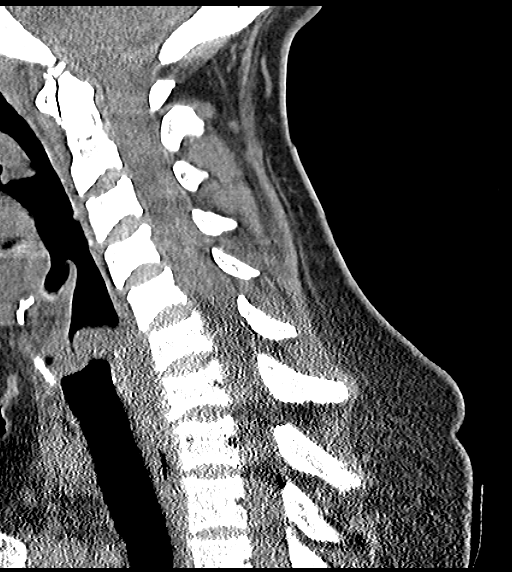
[im 23/45  bone]
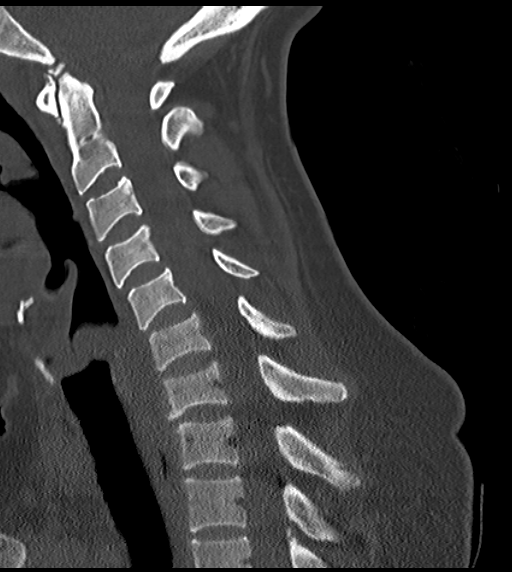
[im 26/45  bone]
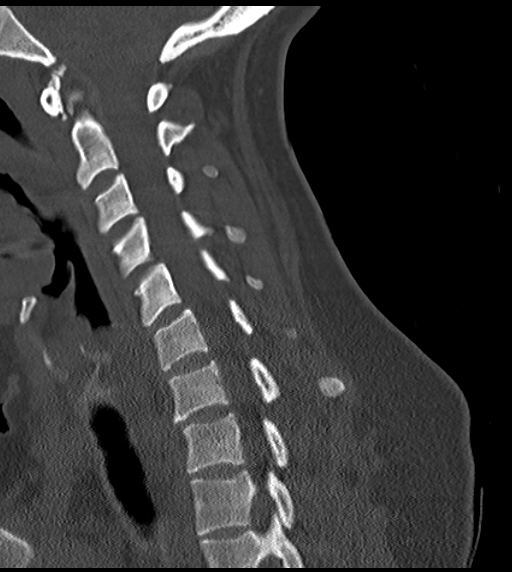
[im 30/45  bone]
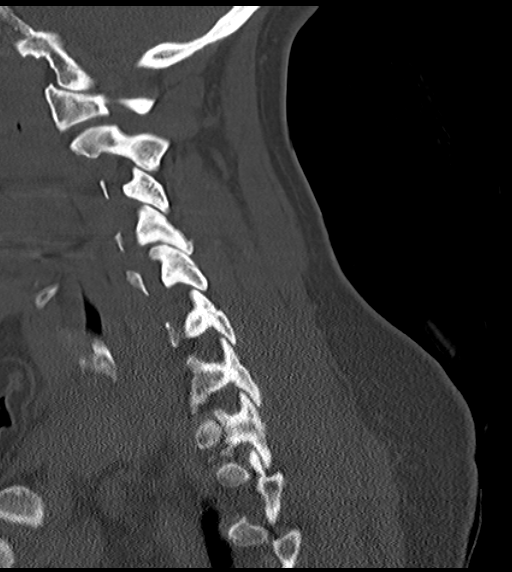

[Series 10: orthogonals · axial · 0.21mm/px · z∈[-280,-171]mm · 5 of 114 slices shown, 7 images]
[im 19/114  soft-tissue]
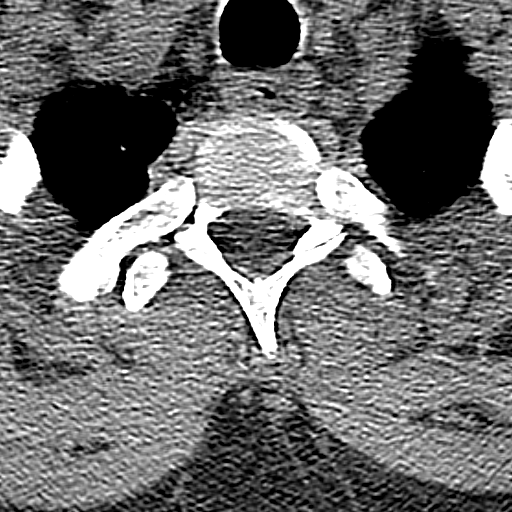
[im 19/114  bone]
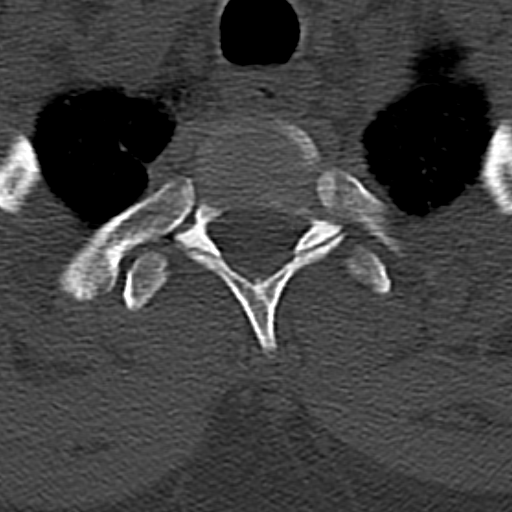
[im 38/114  bone]
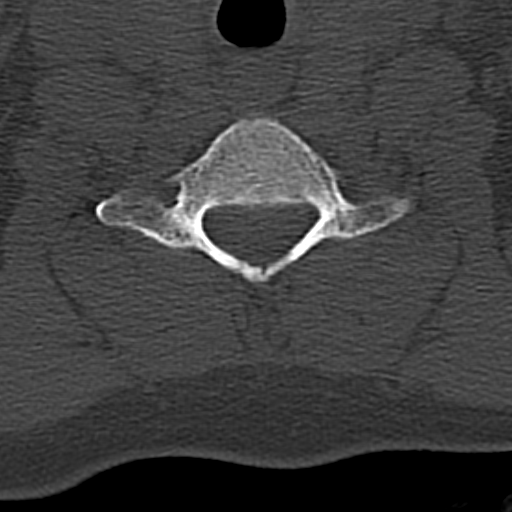
[im 57/114  bone]
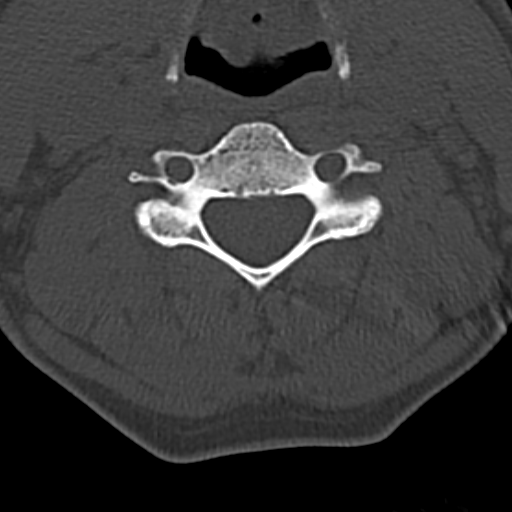
[im 76/114  bone]
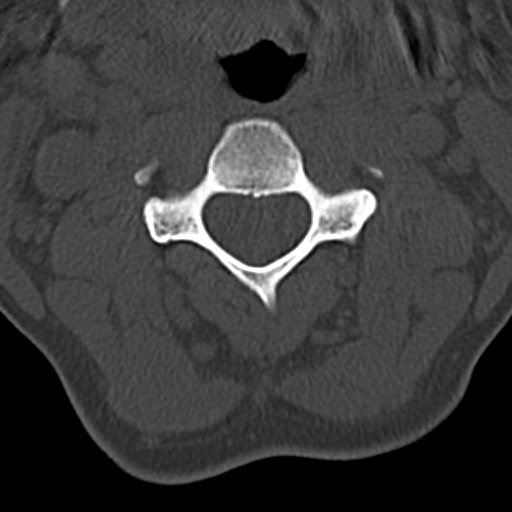
[im 95/114  soft-tissue]
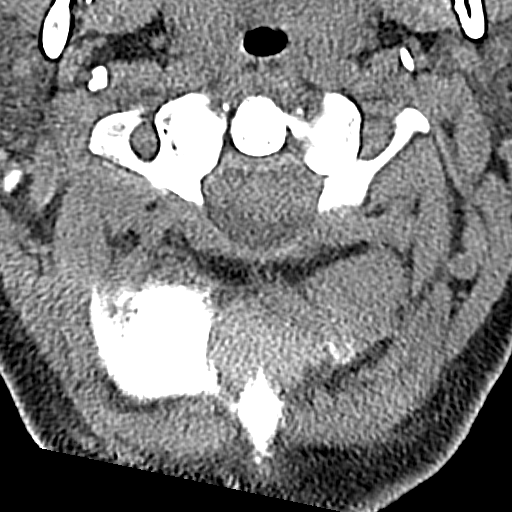
[im 95/114  bone]
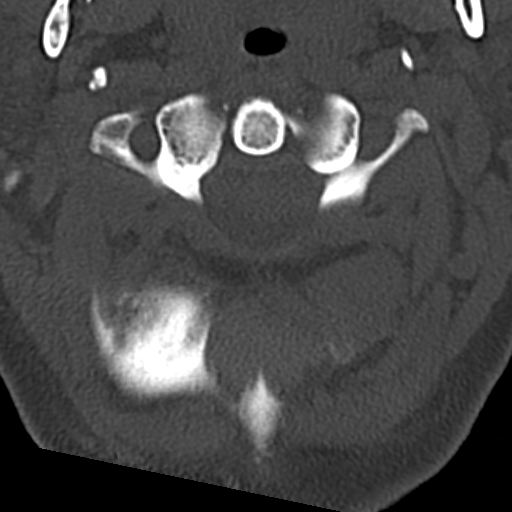

[13 of 33 positions shown; findings below may reference images not displayed]

FINDINGS: CT HEAD FINDINGS

No skull fracture. No hemorrhage or extra-axial fluid. No infarct or
mass. No hydrocephalus.

CT CERVICAL SPINE FINDINGS

Normal alignment. No prevertebral soft tissue swelling. No fracture.
No acute soft tissue findings. Mild C7-T1 degenerative disc disease.
Lung apices clear.
IMPRESSION: No acute traumatic injury.

## 2015-10-14 IMAGING — DX DG ANKLE COMPLETE 3+V*R*
3 series · 3 of 3 positions shown · non-contrast
Comparison: None.

CLINICAL DATA: Status post fall.  Pain and swelling.

EXAM:
RIGHT ANKLE - COMPLETE 3+ VIEW

[ankle ap]
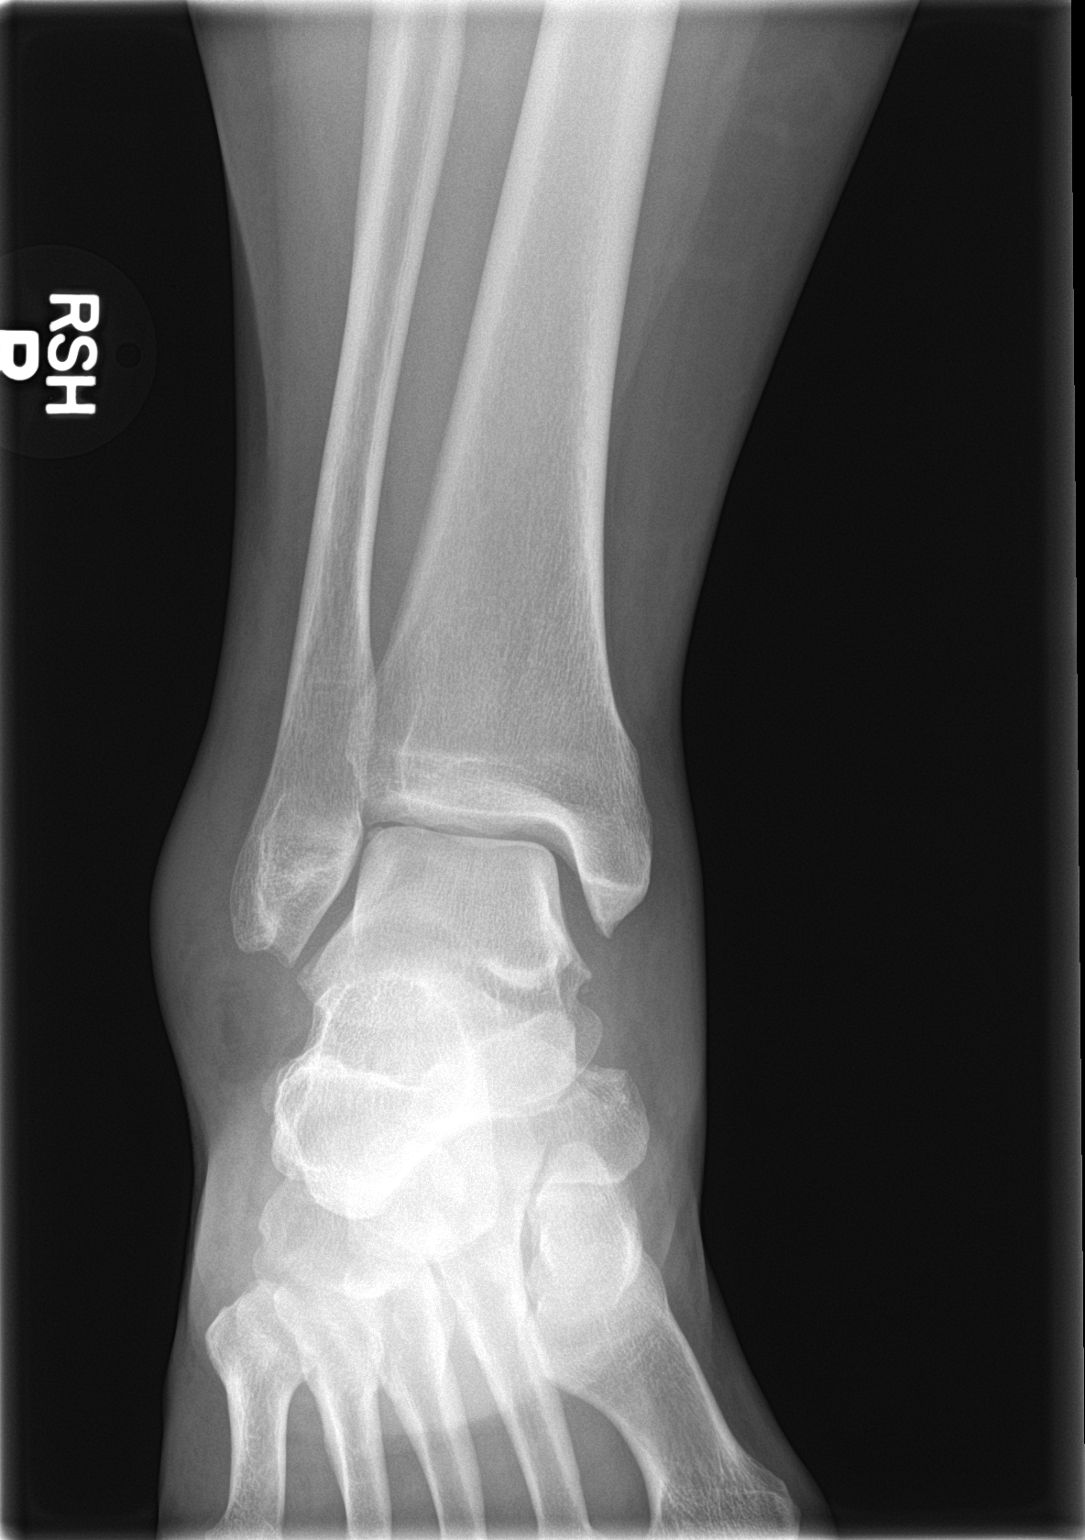

[ankle obl]
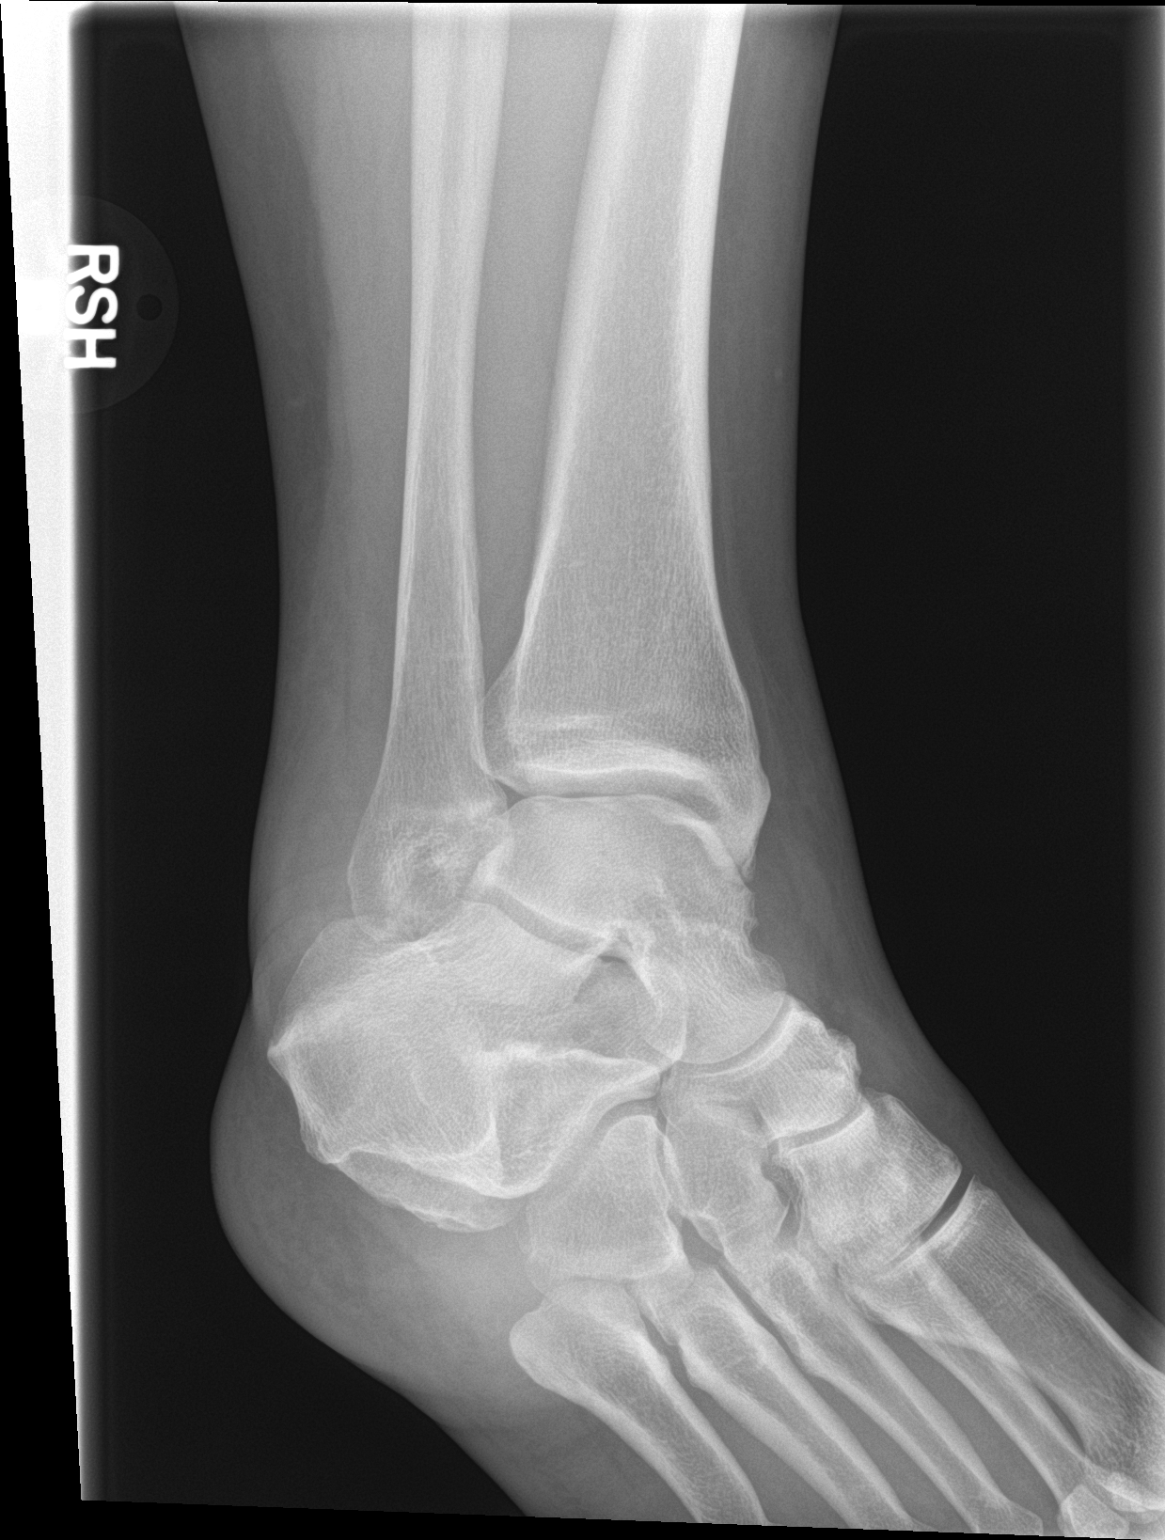

[ankle lat]
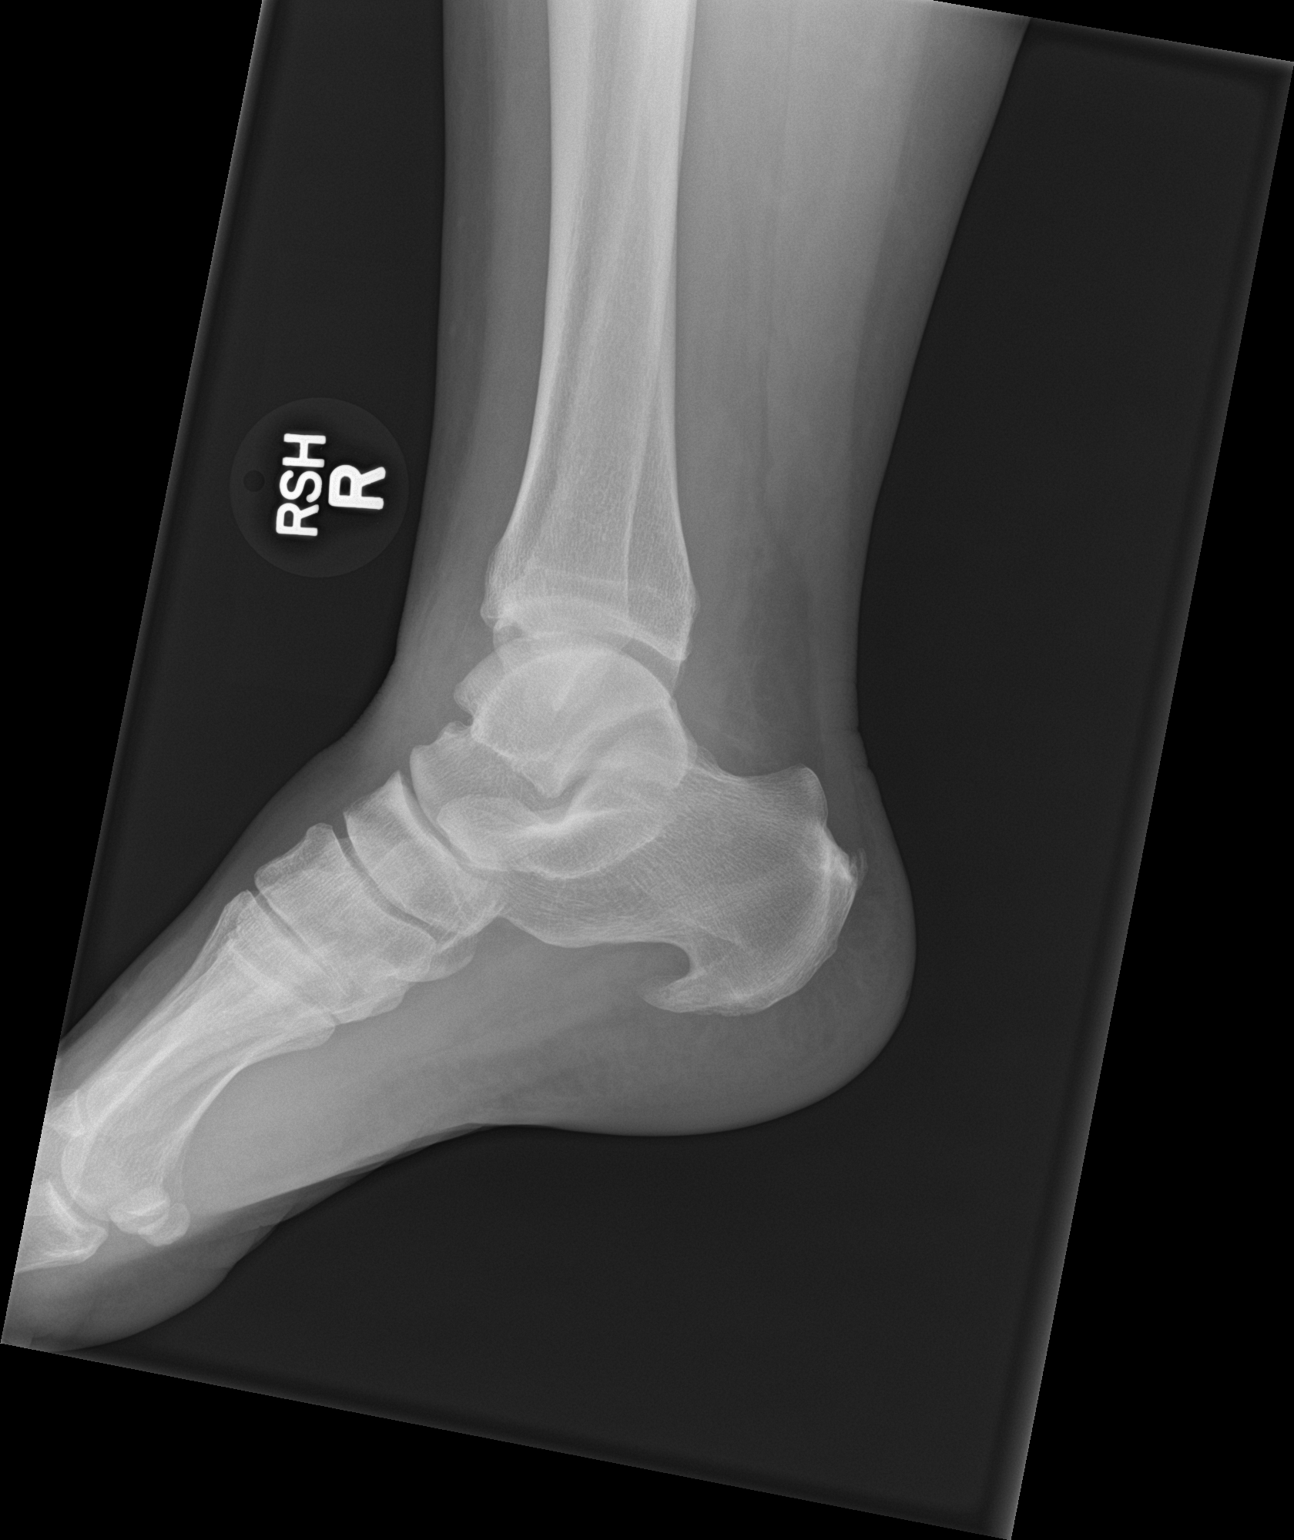

[3 of 3 positions shown; findings below may reference images not displayed]

FINDINGS: There is no evidence of fracture, dislocation, or joint effusion. 2
mm calcification projecting over the lateral tibiotalar joint likely
representing a small loose body. There is a bulky plantar calcaneal
spur. Severe soft tissue swelling over the lateral malleolus. Mild
soft tissue swelling over the medial malleolus.
IMPRESSION: No acute osseous injury of the right ankle.
